# Patient Record
Sex: Female | Born: 1955 | Race: Black or African American | Hispanic: No | Marital: Married | Smoking: Former smoker
Health system: Southern US, Community
[De-identification: ages and names within clinical notes are randomized; demographics above are authoritative.]

## PROBLEM LIST (undated history)

## (undated) DIAGNOSIS — E785 Hyperlipidemia, unspecified: Secondary | ICD-10-CM

## (undated) DIAGNOSIS — D689 Coagulation defect, unspecified: Secondary | ICD-10-CM

## (undated) DIAGNOSIS — I10 Essential (primary) hypertension: Secondary | ICD-10-CM

## (undated) HISTORY — PX: VASCULAR SURGERY: SHX849

## (undated) HISTORY — DX: Hyperlipidemia, unspecified: E78.5

## (undated) HISTORY — DX: Coagulation defect, unspecified: D68.9

## (undated) HISTORY — PX: FOOT AMPUTATION: SHX951

## (undated) HISTORY — DX: Essential (primary) hypertension: I10

---

## 1997-08-04 ENCOUNTER — Inpatient Hospital Stay (HOSPITAL_COMMUNITY): Admission: EM | Admit: 1997-08-04 | Discharge: 1997-08-07 | Payer: Self-pay | Admitting: Emergency Medicine

## 1999-10-10 ENCOUNTER — Encounter: Admission: RE | Admit: 1999-10-10 | Discharge: 1999-10-10 | Payer: Self-pay | Admitting: Family Medicine

## 1999-10-10 ENCOUNTER — Encounter: Payer: Self-pay | Admitting: Family Medicine

## 2000-03-09 ENCOUNTER — Encounter: Admission: RE | Admit: 2000-03-09 | Discharge: 2000-03-09 | Payer: Self-pay | Admitting: Family Medicine

## 2000-03-09 ENCOUNTER — Encounter: Payer: Self-pay | Admitting: Family Medicine

## 2000-11-02 ENCOUNTER — Inpatient Hospital Stay (HOSPITAL_COMMUNITY): Admission: EM | Admit: 2000-11-02 | Discharge: 2000-11-03 | Payer: Self-pay | Admitting: Emergency Medicine

## 2000-11-02 ENCOUNTER — Encounter: Payer: Self-pay | Admitting: Emergency Medicine

## 2002-06-21 ENCOUNTER — Ambulatory Visit (HOSPITAL_COMMUNITY): Admission: RE | Admit: 2002-06-21 | Discharge: 2002-06-21 | Payer: Self-pay | Admitting: Internal Medicine

## 2003-08-21 ENCOUNTER — Emergency Department (HOSPITAL_COMMUNITY): Admission: EM | Admit: 2003-08-21 | Discharge: 2003-08-21 | Payer: Self-pay | Admitting: Emergency Medicine

## 2004-05-01 ENCOUNTER — Ambulatory Visit (HOSPITAL_COMMUNITY): Admission: RE | Admit: 2004-05-01 | Discharge: 2004-05-01 | Payer: Self-pay | Admitting: Internal Medicine

## 2004-05-03 ENCOUNTER — Emergency Department (HOSPITAL_COMMUNITY): Admission: EM | Admit: 2004-05-03 | Discharge: 2004-05-03 | Payer: Self-pay | Admitting: Family Medicine

## 2004-05-21 ENCOUNTER — Ambulatory Visit (HOSPITAL_COMMUNITY): Admission: RE | Admit: 2004-05-21 | Discharge: 2004-05-21 | Payer: Self-pay | Admitting: Orthopedic Surgery

## 2004-05-21 ENCOUNTER — Ambulatory Visit (HOSPITAL_BASED_OUTPATIENT_CLINIC_OR_DEPARTMENT_OTHER): Admission: RE | Admit: 2004-05-21 | Discharge: 2004-05-21 | Payer: Self-pay | Admitting: Orthopedic Surgery

## 2004-06-02 ENCOUNTER — Encounter: Admission: RE | Admit: 2004-06-02 | Discharge: 2004-07-22 | Payer: Self-pay | Admitting: Orthopedic Surgery

## 2004-09-14 ENCOUNTER — Emergency Department (HOSPITAL_COMMUNITY): Admission: EM | Admit: 2004-09-14 | Discharge: 2004-09-14 | Payer: Self-pay | Admitting: Family Medicine

## 2005-01-02 ENCOUNTER — Emergency Department (HOSPITAL_COMMUNITY): Admission: EM | Admit: 2005-01-02 | Discharge: 2005-01-02 | Payer: Self-pay | Admitting: Family Medicine

## 2005-06-16 ENCOUNTER — Ambulatory Visit (HOSPITAL_COMMUNITY): Admission: RE | Admit: 2005-06-16 | Discharge: 2005-06-16 | Payer: Self-pay | Admitting: Internal Medicine

## 2006-07-05 ENCOUNTER — Ambulatory Visit (HOSPITAL_COMMUNITY): Admission: RE | Admit: 2006-07-05 | Discharge: 2006-07-05 | Payer: Self-pay | Admitting: Family Medicine

## 2007-07-11 ENCOUNTER — Ambulatory Visit (HOSPITAL_COMMUNITY): Admission: RE | Admit: 2007-07-11 | Discharge: 2007-07-11 | Payer: Self-pay | Admitting: Family Medicine

## 2007-12-30 DIAGNOSIS — I10 Essential (primary) hypertension: Secondary | ICD-10-CM | POA: Insufficient documentation

## 2007-12-30 DIAGNOSIS — F329 Major depressive disorder, single episode, unspecified: Secondary | ICD-10-CM

## 2007-12-30 DIAGNOSIS — J45909 Unspecified asthma, uncomplicated: Secondary | ICD-10-CM | POA: Insufficient documentation

## 2007-12-30 DIAGNOSIS — E785 Hyperlipidemia, unspecified: Secondary | ICD-10-CM

## 2008-01-02 ENCOUNTER — Ambulatory Visit: Payer: Self-pay | Admitting: Pulmonary Disease

## 2008-01-02 DIAGNOSIS — G4710 Hypersomnia, unspecified: Secondary | ICD-10-CM | POA: Insufficient documentation

## 2008-01-02 DIAGNOSIS — E669 Obesity, unspecified: Secondary | ICD-10-CM | POA: Insufficient documentation

## 2008-01-02 DIAGNOSIS — B370 Candidal stomatitis: Secondary | ICD-10-CM

## 2008-01-02 DIAGNOSIS — J309 Allergic rhinitis, unspecified: Secondary | ICD-10-CM | POA: Insufficient documentation

## 2008-01-03 ENCOUNTER — Telehealth: Payer: Self-pay | Admitting: Pulmonary Disease

## 2008-01-12 ENCOUNTER — Inpatient Hospital Stay (HOSPITAL_COMMUNITY): Admission: EM | Admit: 2008-01-12 | Discharge: 2008-01-19 | Payer: Self-pay | Admitting: Emergency Medicine

## 2008-01-12 ENCOUNTER — Encounter: Payer: Self-pay | Admitting: Vascular Surgery

## 2008-01-12 ENCOUNTER — Ambulatory Visit: Payer: Self-pay | Admitting: Internal Medicine

## 2008-01-12 ENCOUNTER — Ambulatory Visit: Payer: Self-pay | Admitting: Vascular Surgery

## 2008-01-13 ENCOUNTER — Encounter: Payer: Self-pay | Admitting: Vascular Surgery

## 2008-01-17 ENCOUNTER — Encounter: Payer: Self-pay | Admitting: Cardiology

## 2008-01-17 ENCOUNTER — Encounter: Payer: Self-pay | Admitting: Pulmonary Disease

## 2008-01-24 ENCOUNTER — Ambulatory Visit: Payer: Self-pay | Admitting: Vascular Surgery

## 2008-01-27 ENCOUNTER — Encounter (INDEPENDENT_AMBULATORY_CARE_PROVIDER_SITE_OTHER): Payer: Self-pay | Admitting: Gastroenterology

## 2008-01-27 ENCOUNTER — Emergency Department (HOSPITAL_COMMUNITY): Admission: EM | Admit: 2008-01-27 | Discharge: 2008-01-27 | Payer: Self-pay | Admitting: Emergency Medicine

## 2008-02-21 ENCOUNTER — Ambulatory Visit: Payer: Self-pay | Admitting: Vascular Surgery

## 2008-02-22 ENCOUNTER — Ambulatory Visit: Payer: Self-pay | Admitting: Vascular Surgery

## 2008-02-27 ENCOUNTER — Ambulatory Visit: Payer: Self-pay | Admitting: Vascular Surgery

## 2008-02-27 ENCOUNTER — Encounter: Payer: Self-pay | Admitting: Vascular Surgery

## 2008-02-27 ENCOUNTER — Inpatient Hospital Stay (HOSPITAL_COMMUNITY): Admission: RE | Admit: 2008-02-27 | Discharge: 2008-03-06 | Payer: Self-pay | Admitting: Vascular Surgery

## 2008-02-29 ENCOUNTER — Encounter: Payer: Self-pay | Admitting: Vascular Surgery

## 2008-03-13 ENCOUNTER — Ambulatory Visit: Payer: Self-pay | Admitting: Vascular Surgery

## 2008-03-27 ENCOUNTER — Ambulatory Visit: Payer: Self-pay | Admitting: Vascular Surgery

## 2008-04-03 ENCOUNTER — Encounter: Admission: RE | Admit: 2008-04-03 | Discharge: 2008-07-02 | Payer: Self-pay | Admitting: Family Medicine

## 2008-04-10 ENCOUNTER — Ambulatory Visit: Payer: Self-pay | Admitting: Vascular Surgery

## 2008-04-25 ENCOUNTER — Inpatient Hospital Stay (HOSPITAL_COMMUNITY): Admission: RE | Admit: 2008-04-25 | Discharge: 2008-05-01 | Payer: Self-pay | Admitting: Orthopedic Surgery

## 2008-04-25 ENCOUNTER — Encounter (INDEPENDENT_AMBULATORY_CARE_PROVIDER_SITE_OTHER): Payer: Self-pay | Admitting: Orthopedic Surgery

## 2008-05-31 ENCOUNTER — Inpatient Hospital Stay (HOSPITAL_COMMUNITY): Admission: RE | Admit: 2008-05-31 | Discharge: 2008-06-02 | Payer: Self-pay | Admitting: Orthopedic Surgery

## 2008-07-23 ENCOUNTER — Ambulatory Visit: Payer: Self-pay | Admitting: Vascular Surgery

## 2008-07-24 ENCOUNTER — Ambulatory Visit (HOSPITAL_COMMUNITY): Admission: RE | Admit: 2008-07-24 | Discharge: 2008-07-24 | Payer: Self-pay | Admitting: Family Medicine

## 2008-11-27 ENCOUNTER — Emergency Department (HOSPITAL_BASED_OUTPATIENT_CLINIC_OR_DEPARTMENT_OTHER): Admission: EM | Admit: 2008-11-27 | Discharge: 2008-11-28 | Payer: Self-pay | Admitting: Emergency Medicine

## 2008-11-28 ENCOUNTER — Ambulatory Visit: Payer: Self-pay | Admitting: Interventional Radiology

## 2008-12-13 ENCOUNTER — Ambulatory Visit: Payer: Self-pay | Admitting: Vascular Surgery

## 2009-01-14 ENCOUNTER — Emergency Department (HOSPITAL_BASED_OUTPATIENT_CLINIC_OR_DEPARTMENT_OTHER): Admission: EM | Admit: 2009-01-14 | Discharge: 2009-01-14 | Payer: Self-pay | Admitting: Emergency Medicine

## 2009-02-04 ENCOUNTER — Ambulatory Visit: Payer: Self-pay | Admitting: Vascular Surgery

## 2009-02-28 ENCOUNTER — Ambulatory Visit: Payer: Self-pay | Admitting: Vascular Surgery

## 2009-06-27 ENCOUNTER — Encounter: Admission: RE | Admit: 2009-06-27 | Discharge: 2009-06-27 | Payer: Self-pay | Admitting: Nurse Practitioner

## 2009-07-26 ENCOUNTER — Ambulatory Visit (HOSPITAL_COMMUNITY): Admission: RE | Admit: 2009-07-26 | Discharge: 2009-07-26 | Payer: Self-pay | Admitting: Family Medicine

## 2009-08-02 ENCOUNTER — Encounter: Admission: RE | Admit: 2009-08-02 | Discharge: 2009-08-02 | Payer: Self-pay | Admitting: Family Medicine

## 2009-12-01 IMAGING — CR DG TIBIA/FIBULA 2V*L*
4 series · 4 of 4 positions shown · non-contrast
Comparison: Left knee films on 05/03/2004

CLINICAL DATA: Left lower leg pain.

LEFT TIBIA AND FIBULA - 2 VIEW

[t tib/fib ap left (1 of 2)]
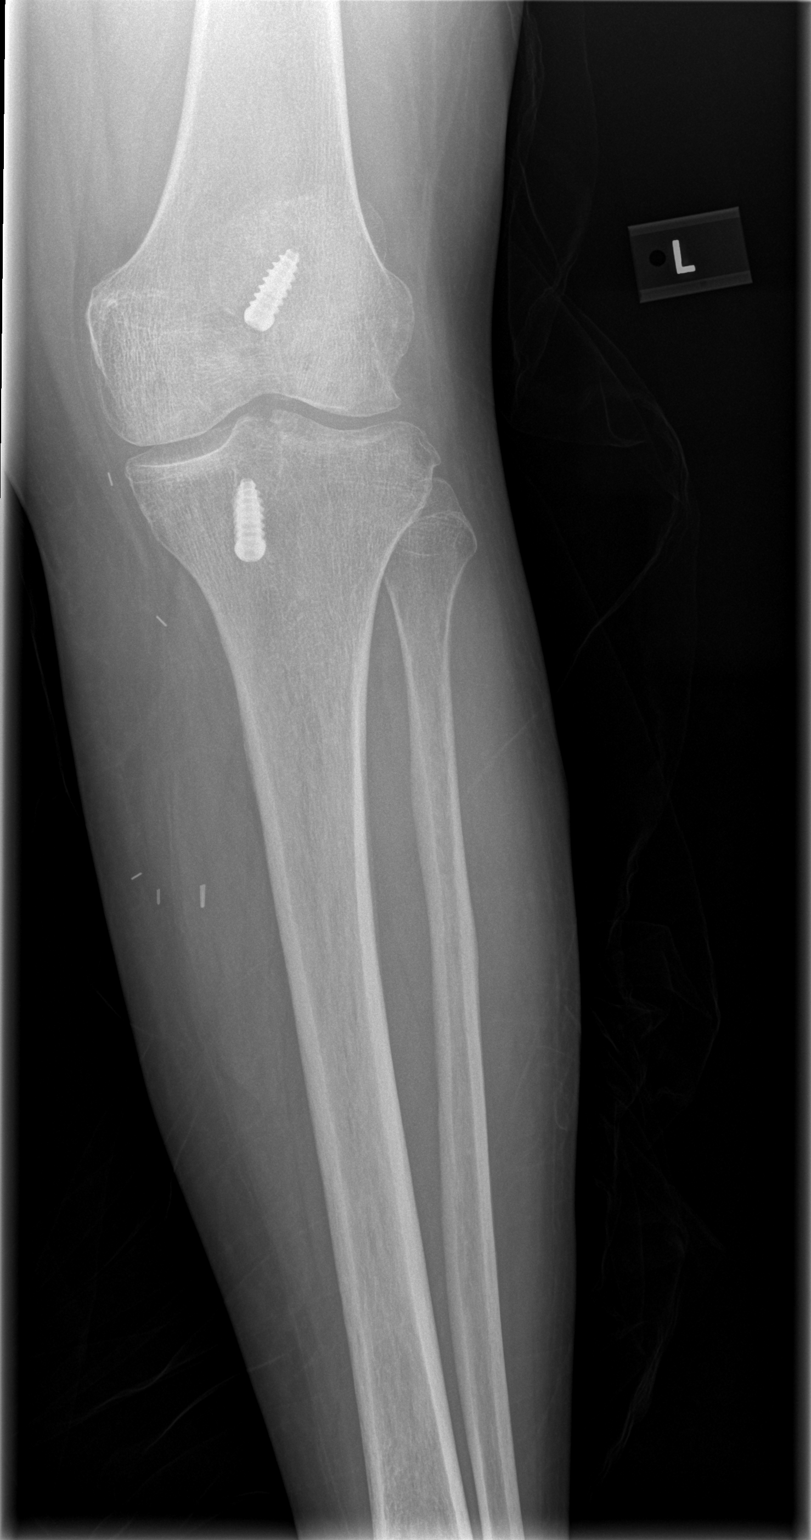

[t tib/fib ap left (2 of 2)]
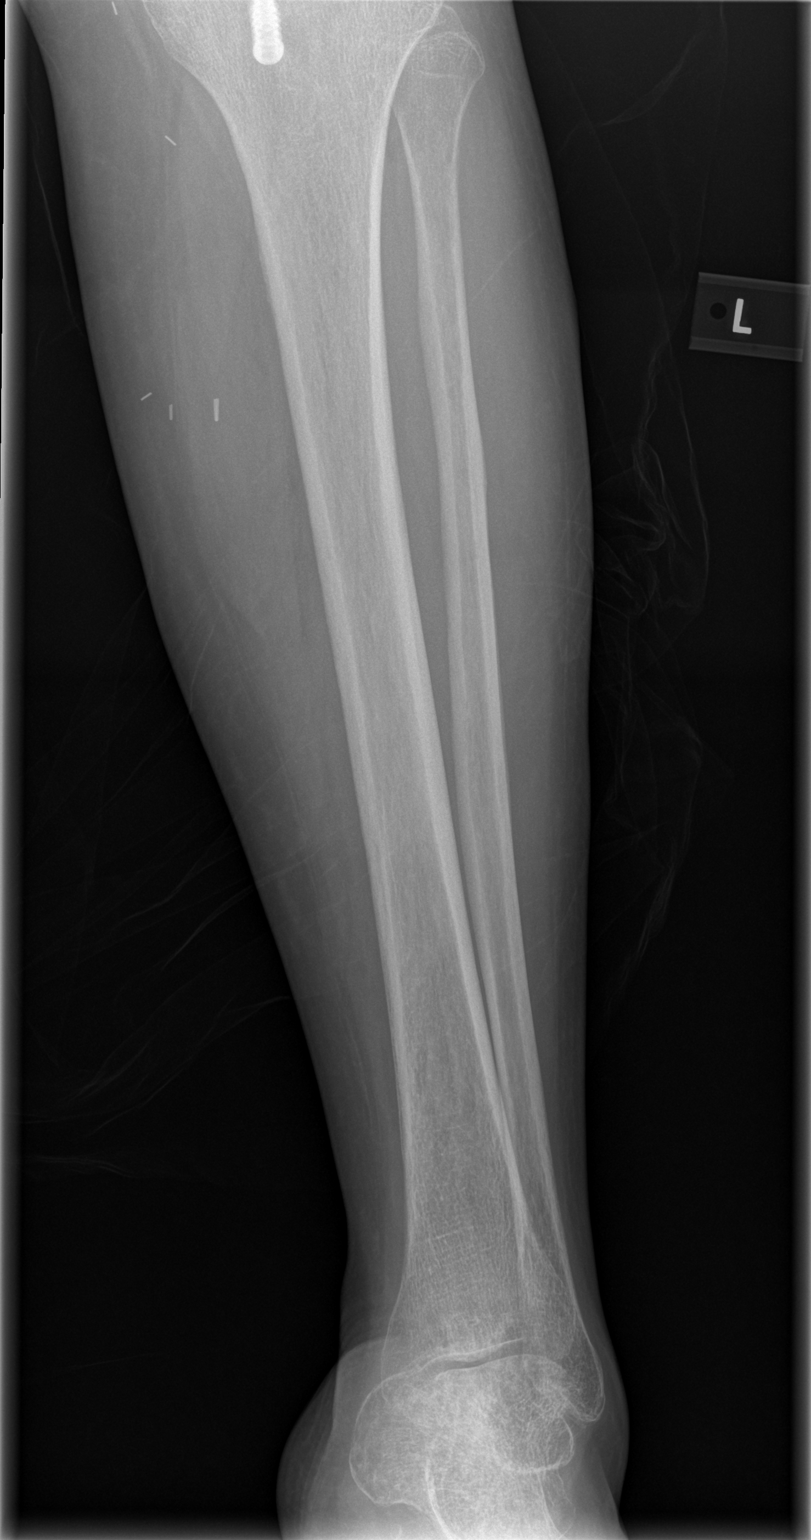

[t tib/fib lat left (1 of 2)]
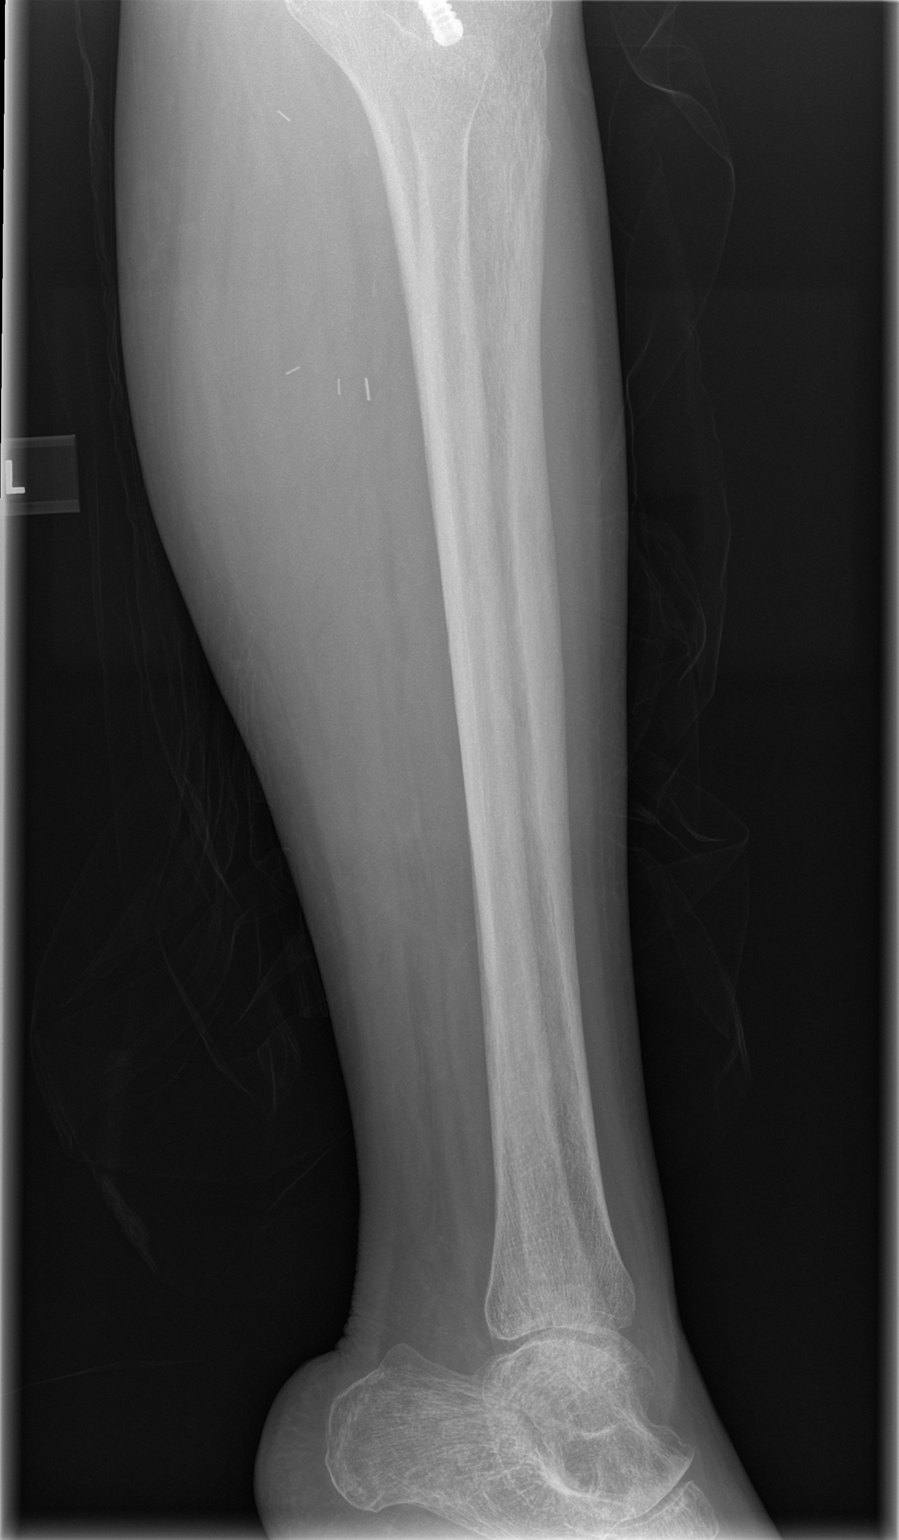

[t tib/fib lat left (2 of 2)]
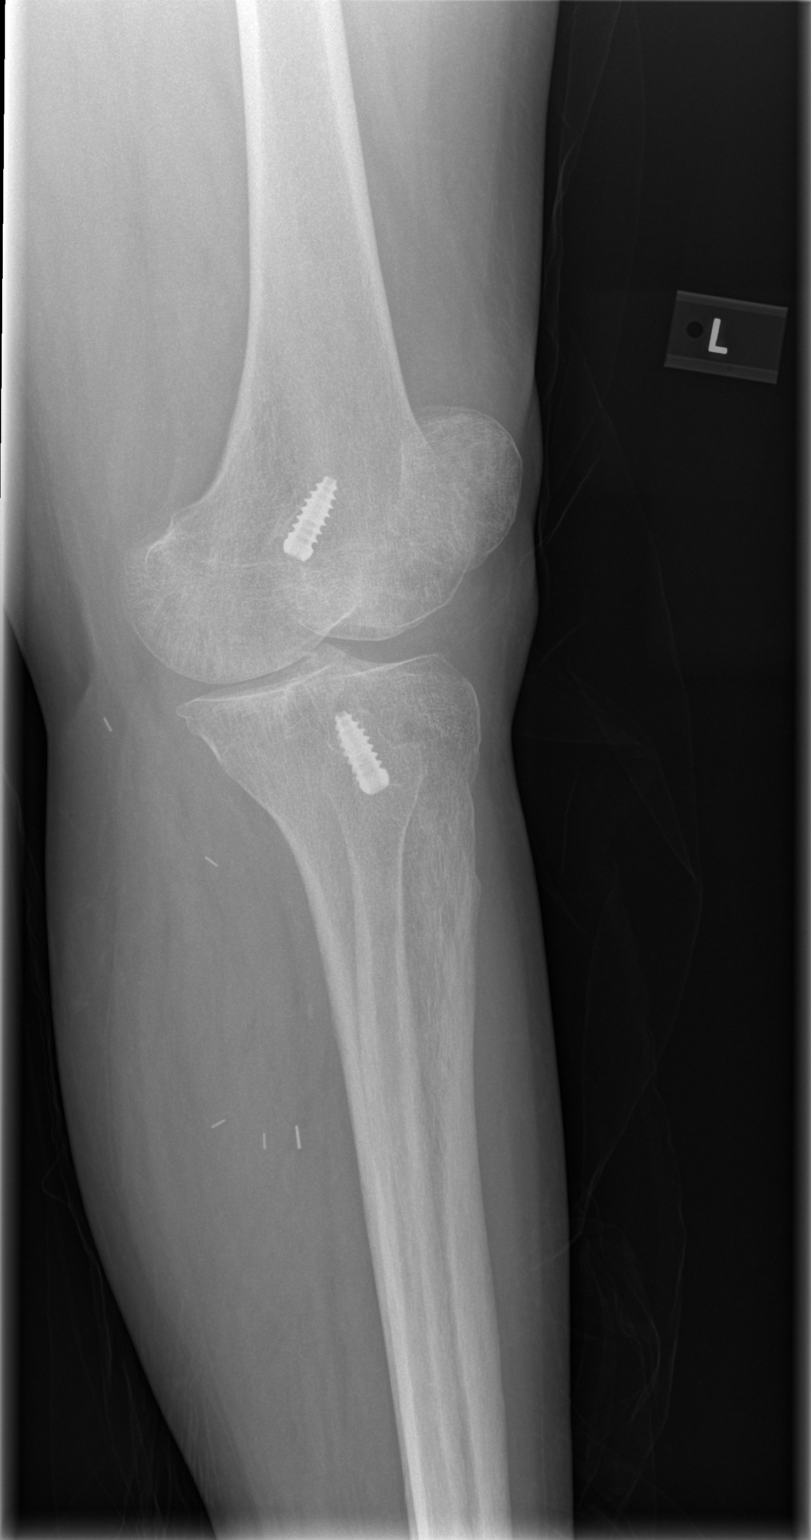

[4 of 4 positions shown; findings below may reference images not displayed]

FINDINGS: Bones show osteopenia.  No fracture, bony erosion or
lesion is seen.  The soft tissues are unremarkable.  There are
screws in place related to prior ACL surgery.
IMPRESSION: Osteopenia.  No acute abnormalities.

## 2010-02-11 ENCOUNTER — Encounter: Admission: RE | Admit: 2010-02-11 | Discharge: 2010-02-11 | Payer: Self-pay | Admitting: Family Medicine

## 2010-03-20 ENCOUNTER — Ambulatory Visit: Payer: Self-pay | Admitting: Vascular Surgery

## 2010-04-25 ENCOUNTER — Inpatient Hospital Stay (HOSPITAL_COMMUNITY)
Admission: EM | Admit: 2010-04-25 | Discharge: 2010-04-29 | Disposition: A | Discharge status: Home / Self Care | Payer: Self-pay | Source: Home / Self Care | Attending: Cardiology | Admitting: Cardiology

## 2010-04-25 LAB — BASIC METABOLIC PANEL
BUN: 15 mg/dL (ref 6–23)
CO2: 23 mEq/L (ref 19–32)
Chloride: 108 mEq/L (ref 96–112)
GFR calc Af Amer: >60 mL/min (ref >60)
Potassium: 3.4 mEq/L — ABNORMAL LOW (ref 3.5–5.1)

## 2010-04-25 LAB — POCT CARDIAC MARKERS: CKMB, poc: <1 ng/mL — ABNORMAL LOW (ref 1.0–8.0)

## 2010-04-25 LAB — CBC
MCH: 26.6 pg (ref 26.0–34.0)
MCHC: 31.7 g/dL (ref 30.0–36.0)
MCV: 84 fL (ref 78.0–100.0)
RDW: 16.2 % — ABNORMAL HIGH (ref 11.5–15.5)

## 2010-04-25 LAB — PROTIME-INR: INR: >10 (ref 0.00–1.49)

## 2010-04-25 LAB — GLUCOSE, CAPILLARY: Glucose-Capillary: 137 mg/dL — ABNORMAL HIGH (ref 70–99)

## 2010-04-25 LAB — TROPONIN I: Troponin I: <0.01 ng/mL (ref 0.00–0.06)

## 2010-04-26 LAB — GLUCOSE, CAPILLARY
Glucose-Capillary: 142 mg/dL — ABNORMAL HIGH (ref 70–99)
Glucose-Capillary: 115 mg/dL — ABNORMAL HIGH (ref 70–99)

## 2010-04-26 LAB — CARDIAC PANEL(CRET KIN+CKTOT+MB+TROPI)
CK, MB: 1.5 ng/mL (ref 0.3–4.0)
CK, MB: 1.3 ng/mL (ref 0.3–4.0)
Relative Index: 0.7 (ref 0.0–2.5)
Total CK: 159 U/L (ref 7–177)
Troponin I: 0.02 ng/mL (ref 0.00–0.06)

## 2010-04-26 LAB — HEMOGLOBIN A1C: Mean Plasma Glucose: 154 mg/dL — ABNORMAL HIGH (ref <117)

## 2010-04-26 LAB — PROTIME-INR
INR: 7.85 (ref 0.00–1.49)
Prothrombin Time: 65.4 seconds — ABNORMAL HIGH (ref 11.6–15.2)

## 2010-04-26 LAB — CBC
Hemoglobin: 12.7 g/dL (ref 12.0–15.0)
MCHC: 32.3 g/dL (ref 30.0–36.0)
RDW: 16.1 % — ABNORMAL HIGH (ref 11.5–15.5)
WBC: 12.2 10*3/uL — ABNORMAL HIGH (ref 4.0–10.5)

## 2010-04-26 LAB — COMPREHENSIVE METABOLIC PANEL
ALT: 19 U/L (ref 0–35)
AST: 19 U/L (ref 0–37)
Calcium: 8.9 mg/dL (ref 8.4–10.5)
GFR calc Af Amer: >60 mL/min (ref >60)
Sodium: 150 mEq/L — ABNORMAL HIGH (ref 135–145)
Total Protein: 7.5 g/dL (ref 6.0–8.3)

## 2010-04-26 LAB — LIPASE, BLOOD: Lipase: 27 U/L (ref 11–59)

## 2010-04-27 LAB — CBC
MCH: 26.2 pg (ref 26.0–34.0)
MCV: 82.9 fL (ref 78.0–100.0)
Platelets: 257 10*3/uL (ref 150–400)
RDW: 15.8 % — ABNORMAL HIGH (ref 11.5–15.5)

## 2010-04-27 LAB — BASIC METABOLIC PANEL
BUN: 13 mg/dL (ref 6–23)
Chloride: 104 mEq/L (ref 96–112)
Creatinine, Ser: 1.07 mg/dL (ref 0.4–1.2)

## 2010-04-27 LAB — PROTIME-INR: Prothrombin Time: 16.3 seconds — ABNORMAL HIGH (ref 11.6–15.2)

## 2010-04-28 ENCOUNTER — Encounter: Payer: Self-pay | Admitting: Cardiovascular Disease

## 2010-04-28 LAB — CBC
HCT: 34.3 % — ABNORMAL LOW (ref 36.0–46.0)
HCT: 35.6 % — ABNORMAL LOW (ref 36.0–46.0)
Hemoglobin: 11 g/dL — ABNORMAL LOW (ref 12.0–15.0)
Hemoglobin: 11.4 g/dL — ABNORMAL LOW (ref 12.0–15.0)
MCH: 26.7 pg (ref 26.0–34.0)
MCH: 26.4 pg (ref 26.0–34.0)
MCHC: 32.1 g/dL (ref 30.0–36.0)
MCHC: 32 g/dL (ref 30.0–36.0)
MCV: 83.3 fL (ref 78.0–100.0)
MCV: 82.4 fL (ref 78.0–100.0)
Platelets: 252 K/uL (ref 150–400)
Platelets: 276 K/uL (ref 150–400)
RBC: 4.12 MIL/uL (ref 3.87–5.11)
RBC: 4.32 MIL/uL (ref 3.87–5.11)
RDW: 15.6 % — ABNORMAL HIGH (ref 11.5–15.5)
RDW: 15.6 % — ABNORMAL HIGH (ref 11.5–15.5)
WBC: 12.3 K/uL — ABNORMAL HIGH (ref 4.0–10.5)
WBC: 12.1 K/uL — ABNORMAL HIGH (ref 4.0–10.5)

## 2010-04-28 LAB — BASIC METABOLIC PANEL
CO2: 23 mEq/L (ref 19–32)
Chloride: 104 mEq/L (ref 96–112)
GFR calc Af Amer: 58 mL/min — ABNORMAL LOW (ref >60)
Potassium: 3.6 mEq/L (ref 3.5–5.1)
Sodium: 139 mEq/L (ref 135–145)

## 2010-04-28 LAB — PROTIME-INR
INR: 1.3 (ref 0.00–1.49)
Prothrombin Time: 16.4 s — ABNORMAL HIGH (ref 11.6–15.2)

## 2010-04-28 LAB — GLUCOSE, CAPILLARY
Glucose-Capillary: 91 mg/dL (ref 70–99)
Glucose-Capillary: 114 mg/dL — ABNORMAL HIGH (ref 70–99)
Glucose-Capillary: 85 mg/dL (ref 70–99)
Glucose-Capillary: 104 mg/dL — ABNORMAL HIGH (ref 70–99)
Glucose-Capillary: 100 mg/dL — ABNORMAL HIGH (ref 70–99)

## 2010-04-28 LAB — HEPARIN LEVEL (UNFRACTIONATED)

## 2010-04-29 LAB — BASIC METABOLIC PANEL
BUN: 12 mg/dL (ref 6–23)
CO2: 26 mEq/L (ref 19–32)
Calcium: 9.3 mg/dL (ref 8.4–10.5)
Chloride: 106 mEq/L (ref 96–112)
Creatinine, Ser: 1.07 mg/dL (ref 0.4–1.2)
GFR calc Af Amer: >60 mL/min (ref >60)

## 2010-04-29 LAB — CBC
MCHC: 32.6 g/dL (ref 30.0–36.0)
Platelets: 269 10*3/uL (ref 150–400)
RDW: 15.3 % (ref 11.5–15.5)
WBC: 10.4 10*3/uL (ref 4.0–10.5)

## 2010-04-29 LAB — PROTIME-INR: INR: 1.13 (ref 0.00–1.49)

## 2010-04-29 LAB — HEPARIN LEVEL (UNFRACTIONATED): Heparin Unfractionated: <0.1 IU/mL — ABNORMAL LOW (ref 0.30–0.70)

## 2010-04-29 LAB — GLUCOSE, CAPILLARY
Glucose-Capillary: 101 mg/dL — ABNORMAL HIGH (ref 70–99)
Glucose-Capillary: 95 mg/dL (ref 70–99)

## 2010-04-30 NOTE — Discharge Summary (Addendum)
NAMESECILIA, APPS         ACCOUNT NO.:  [id number]  MEDICAL RECORD NO.:  [id number]          PATIENT TYPE:  INP  LOCATION:  3707                         FACILITY:  [facility]  PHYSICIAN:  Madolyn Frieze. Jens Som, MD, FACCDATE OF BIRTH:  1955-06-01  DATE OF ADMISSION:  04/25/2010 DATE OF DISCHARGE:  04/29/2010                              DISCHARGE SUMMARY   DISCHARGE DIAGNOSES: 1. Chest pain, negative for myocardial infarction.  Cardiac     catheterization on April 28, 2010, demonstrating normal coronary     arteries. 2. Coumadin therapy with supratherapeutic INR on admission is greater     than 10, treated with vitamin K.  INR felt subtherapeutic at     discharge, requiring cross coverage with Lovenox.  Discharge INR is     1.13. 3. Peripheral vascular disease.     a.     Prior embolectomy for last iliofemoral embolism requiring      patch angioplasty of left femoral artery and tibial left knee.     b.     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 4. Hypertension. 5. Diabetes mellitus. 6. Family history of coronary artery disease. 7. Former tobacco abuse.  HOSPITAL COURSE:  Ms. [name] is a 55 year old female with a hypercoagulable state, previously on Coumadin including peripheral vascular disease, hypertension, and diabetes.  She presented to the hospital with complaints of substernal chest pain starting around 11:30 in the morning.  In the ER, there was no evidence of ST-elevation MI, but there are dynamic T-wave changes.  She was started on heparin and nitroglycerin up until which point, it was discovered the patient was on Coumadin, her INR was greater than 10.  Her heparin was discontinued. The patient was initially given 2 mg of vitamin K.  She was also given fentanyl and Lopressor and her pain was slowly was resolving.  She also had some nausea and vomiting and KUB, amylase, and lipase were  normal. Even with successive doses of vitamin K, her INR still remained supratherapeutic, so she received another dose.  On April 27, 2010, her INR fell to 1.29.  Because of her concerning issues including chest pain, she underwent cardiac catheterization on April 28, 2010, by Dr. [name], who actually found no significant coronary artery disease. Ejection fraction was 55%.  Because of her prior embolic event to her left leg and hypercoagulable state, she was evaluated for cross coverage with Lovenox and was deemed a candidate for such.  Pharmacy is recommend her to go home on 120 mg daily.  I have spoken with pharmacy with regards to Coumadin dosing at discharge given her extremely high INR on admission.  __________ 5 mg daily with an INR check on May 02, 2010, which will be arranged.  See the patient's primary care provider.  Dr. Jens Som has seen and examined her today and feels, she is stable for discharge.  DISCHARGE LABORATORY DATA:  WBC 10.4, hemoglobin 11.7, hematocrit 35.9, platelet count 259, INR at discharge 1.13.  Sodium 141, potassium 2.3, chloride 106, CO2 of 26, glucose 100, BUN 12, creatinine 1.07.  LFTs  were normal on April 18, 2010.  Hemoglobin received 7.0.  Cardiac enzymes negative x4 with exception of mildly increased CK up to 188 and 2 of the markers.  TSH was normal of 0.58.  STUDIES: 1. Abdominal ultrasound on April 18, 2010, showed no bowel     obstructions.  Both large and small bowel gas is present without     distention. 2. Chest x-ray on April 25, 2010, showed no acute disease. 3. Cardiac catheterization on April 28, 2010, demonstrated no     significant coronary artery disease.  Ejection fraction was 55%.     Please see old report for details.  DISCHARGE MEDICATIONS: 1. Lovenox 120 mg subcutaneously daily. 2. Toprol-XL 25 mg daily. 3. Coumadin 5 mg daily with instructions take 1 tablet daily for now     and have her INR checks on Friday  May 02, 2010. 4. Fenofibrate 160 mg daily. 5. Gabapentin 300 mg t.i.d. p.r.n. neuropathy. 6. Glipizide 10 mg daily. 7. Guaifenesin/codeine 10 mL every 6 hours as needed. 8. Lantus 15-18 units subcutaneously at bedtime. 9. Mirtazapine 15 mg at bedtime. 10.Pravastatin 40 mg at bedtime. 11.Spiriva 18 mcg inhaled daily. 12.Ventolin inhaler 2 puffs q.4 h. p.r.n.  Please note the patient's ACE inhibitor was stopped on admission and will be having discharge secondary to borderline blood pressure in low 100s.  It may be restarted as an outpatient, she is stable.  DISPOSITION:  [name] will be discharged in stable condition to home.  She is not to lift or participate in sexual activity for 1 week, drive for 2 days, and may shower.  She is to follow low-sodium, heart- healthy diabetic diet.  If she notices any pain, swelling, bleeding, or pus at cath site, she is to call or return.  She will be given a lab certificate to her primary care provider's office on Friday May 02, 2010, for an INR check.  She will follow up for a post hospital wound check on May 12, 2010, at 10:30 a.m., but then following Dr.Abdul Beirne did not believe that she will require any further follow up with Cardiology.  DURATION OF DISCHARGE ENCOUNTER:  Greater than 30 minutes including physician and PA time.     Dayna Dunn, P.A.C.   ______________________________ Madolyn Frieze. Jens Som, MD, [facility]    DD/MEDQ  D:  04/29/2010  T:  04/30/2010  Job:  [id number]  cc:   Madolyn Frieze. Jens Som, MD, [facility]  Electronically Signed by Ronie Spies  on 05/12/2010 09:42:52 PM Electronically Signed by Olga Millers MD [facility] on 05/14/2010 07:19:55 PM

## 2010-05-01 NOTE — Procedures (Signed)
  [name], [name]         ACCOUNT NO.:  [id number]  MEDICAL RECORD NO.:  [id number]          PATIENT TYPE:  INP  LOCATION:  3707                         FACILITY:  [facility]  PHYSICIAN:  Noralyn Pick. [name], MD, FACCDATE OF BIRTH:  1955-09-26  DATE OF PROCEDURE:  04/28/2010 DATE OF DISCHARGE:                           CARDIAC CATHETERIZATION   A 55 year old patient with hypercoagulable state previously on Coumadin admitted to the hospital for chest pain.  Cine catheterization was done with 5-French catheters from right femoral artery.  At the end of the case, we had good hemostasis with AngioSeal.  Left main coronary artery had a 20% proximal stenosis.  Left anterior descending artery was normal.  First diagonal branch was extremely large and normal.  Second diagonal branch was small and normal.  Circumflex coronary artery was nondominant.  It was normal.  There was a small first obtuse marginal branch, which was normal.  In the AV groove branch turned into a second obtuse marginal branch, which was normal.  Right coronary artery was dominant and normal.  RAO ventriculography.  RAO ventriculography was normal.  EF of 55%. There was no gradient across the aortic valve.  No MR.  LV pressure was 130/16.  Aortic pressure was 129/71.  IMPRESSION:  The patient has no significant coronary artery disease.  I will resume her heparin without a bolus later tonight and give her a dose of Coumadin.  We will try to arrange home Lovenox to help expedite her discharge.  She has had a previous embolic event to her left leg and has a hypercoagulable state.  I believe she has also had a pulmonary emboli.  She should not be discharged unless she has coverage in regards to her anticoagulation.     Noralyn Pick. [name], MD, [facility]     PCN/MEDQ  D:  04/28/2010  T:  04/29/2010  Job:  [id number]  cc:   Learta Codding, MD,FACC  Electronically Signed by Charlton Haws MD [facility] on 05/01/2010 10:38:54  PM

## 2010-05-12 ENCOUNTER — Ambulatory Visit: Payer: Self-pay | Admitting: Physician Assistant

## 2010-05-13 ENCOUNTER — Other Ambulatory Visit: Payer: Self-pay | Admitting: Nurse Practitioner

## 2010-05-13 DIAGNOSIS — J189 Pneumonia, unspecified organism: Secondary | ICD-10-CM

## 2010-05-15 NOTE — Cardiovascular Report (Addendum)
Summary: [facility] Cardiac Cath  [facility] Cardiac Cath   Imported By: Earl Many 05/07/2010 16:22:26  _____________________________________________________________________  External Attachment:    Type:   Image     Comment:   External Document

## 2010-05-29 NOTE — H&P (Signed)
[name], [name]         ACCOUNT NO.:  [id number]  MEDICAL RECORD NO.:  [id number]          PATIENT TYPE:  INP  LOCATION:  2913                         FACILITY:  [facility]  PHYSICIAN:  Learta Codding, MD,FACC DATE OF BIRTH:  [birth year]  DATE OF ADMISSION:  04/25/2010 DATE OF DISCHARGE:                             HISTORY & PHYSICAL   REASON FOR ADMISSION:  Unstable angina.  HISTORY OF PRESENT ILLNESS:  The patient is a very pleasant 55 year old African American female with a history of peripheral vascular disease status post prior embolectomy for left iliofemoral embolism required patch angioplasty of the left femoral artery and tibial embolectomy. Initially, the patient had an amputation of the left fourth toe.  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  She has no significant prior cardiac history. She did have an echocardiogram done in 2009, which was read by Dr. Jens Som, her ejection fraction was normal.  There was no evidence of source of emboli.  The patient has multiple cardiac risk factors including hypertension, diabetes mellitus, and a family history of coronary artery disease.  The patient is also a former smoker.  She quit recently a couple of weeks ago.  She is now admitted with new onset substernal chest pain which started at around 11:30 this morning.  The pain was intermittent, but severe substernal with radiation to the right neck area and somewhat into the left arm.  She really could not get comfortable for most of the day. She finally came to the Emergency Room where it was found that she had dynamic EKG changes.  There was no evidence of ST elevation myocardial infarction, but there was dynamic T-wave changes.  When I saw the patient and coming at the bedside, she was still having pretty severe pain which was than 8/10.  She was already started on heparin and nitroglycerin, albeit at lower  dose.  While examining the patient, the nurse notified me that her INR was greater than 10 and heparin was discontinued.  We will also get an order to give the patient 2 mg of vitamin K.  She was given 50 mcg of IV fentanyl and is given Lopressor. Her pain is slowly abating.  We got another EKG and she now has upright T-wave changes suggestive of that she has LAD and coronary artery disease.  The patient is also diabetic for which she takes insulin.  She states that she was admitted 2 weeks ago to [facility] for pneumonia.  She did require antibiotics after hospital discharge also.  This is likely the reason for her elevated INR.  Initial set of cardiac enzymes was within normal limits.  Troponin was less than 0.055.  ALLERGIES:  Reportedly the patient is allergic to CODEINE.  She is also allergic to [facility], in addition she is allergic to MORPHINE.  SOCIAL HISTORY:  The patient is disabled given her peripheral vascular disease.  She quit smoking 4 weeks ago.  FAMILY HISTORY:  Noted for father who had a stent placed in the pacemaker.  MEDICATIONS: 1. Coumadin. 2. Fenofibrate. 3. Glipizide 10 mg once a day. 4. Gabapentin  300 mg 3 times a day. 5. Mirtazapine 15 mg at bedtime. 6. Pravastatin 80 mg p.o. at bedtime. 7. Spiriva inhalers.  REVIEW OF SYSTEMS:  The patient reported some diaphoresis during her chest pain.  She also had shortness of breath.  She denies any orthopnea, PND, palpitations, or syncope.  She did have some dizziness. She has no known allergies.  She had dysuria or frequency.  Remainder of 18 point review of systems was otherwise negative.  PHYSICAL EXAMINATION:  VITAL SIGNS:  Blood pressure 110/80, heart rate is 82 beats per minute, respirations are 16, and saturation 94%, GENERAL:  Well-nourished African American female in mild distress. HEENT:  PERLA.  EOMI.  Throat is clear. NECK:  Supple and normal carotid upstroke and no carotid bruits.   No thyromegaly.  No cervical or supraclavicular adenopathy. LUNGS:  Clear bilaterally. HEART:  Regular rate and rhythm with normal S1, S2 and no pathological murmurs. ABDOMEN:  Supple, nontender.  No rebound or guarding.  Good bowel sounds. EXTREMITIES:  No cyanosis, clubbing, or edema.  The patient is status post left mid foot amputation.  She has a good palpable dorsalis pedis pulse and posterior tibial pulse in the right leg. SKIN:  Warm and dry. NEUROLOGIC:  The patient is alert, oriented, and grossly nonfocal.  LABORATORY DATA:  Obtained in the emergency room; white count is 10.9, hemoglobin is 13.3, hematocrit 42, platelet count is 266.  Sodium 142, potassium 3.4, chloride is 108, carbon dioxide 23, glucose 104, BUN is 15, creatinine is 0.99, calcium is 9.3, GFR is greater than 60 mL per minute.  As outlined above, troponin was less than 0.05 and a myoglobin was also within normal limits.  Per laboratory report, the patient's INR was greater than 10 and as outlined above.  Again, she will be administered vitamin K.  PROBLEM LIST: 1. Unstable coronary syndrome. 2. Multiple cardiac risk factors are pending.     a.     Diabetes mellitus.     b.     Hypertension.     c.     Family history of coronary artery disease.     d.     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 3. Former tobacco use. 4. Recent pneumonia treated in [facility]. 5. Chronic anticoagulation with Coumadin.  PLAN: 1. The patient has an acute coronary syndrome.  She has dynamic EKG     changes.  Anticipate that her second troponin will be positive.     She had quite impressive chest pain on admission to the ER, but is     now feeling better.  She had been given several doses of fentanyl     and high dose IV nitroglycerin. 2. PT/INR is markedly elevated and the patient is anticoagulated and     she will be given vitamin K.  Hopefully, we can stabilize her as     well as not to need  and urgent catheterization which require FFP. 3. We will hold off on heparin for now.  We will serially obtain her     PT/INR every 6 hours until further     improvement. 4. I have discussed with the patient the need for diagnostic cardiac     catheterization and the patient has agreed to proceed.  This will     be done urgently if she becomes unstable, otherwise elective on     Monday.     Vernie Shanks  DeGent, MD,FACC   ______________________________ Learta Codding, MD,FACC    GED/MEDQ  D:  04/25/2010  T:  04/26/2010  Job:  [id number]  Electronically Signed by Lewayne Bunting MDFACC on 05/29/2010 10:27:06 AM

## 2010-07-03 LAB — PROTIME-INR: Prothrombin Time: 56.6 seconds — ABNORMAL HIGH (ref 11.6–15.2)

## 2010-07-04 LAB — CBC
MCV: 83 fL (ref 78.0–100.0)
Platelets: 265 10*3/uL (ref 150–400)
RBC: 4.73 MIL/uL (ref 3.87–5.11)
WBC: 15.6 10*3/uL — ABNORMAL HIGH (ref 4.0–10.5)

## 2010-07-04 LAB — LACTIC ACID, PLASMA: Lactic Acid, Venous: 1.1 mmol/L (ref 0.5–2.2)

## 2010-07-04 LAB — DIFFERENTIAL
Basophils Relative: 1 % (ref 0–1)
Eosinophils Relative: 3 % (ref 0–5)
Lymphocytes Relative: 24 % (ref 12–46)
Monocytes Relative: 5 % (ref 3–12)
Neutrophils Relative %: 67 % (ref 43–77)

## 2010-07-04 LAB — BASIC METABOLIC PANEL
BUN: 25 mg/dL — ABNORMAL HIGH (ref 6–23)
Chloride: 105 mEq/L (ref 96–112)
Creatinine, Ser: 1.2 mg/dL (ref 0.4–1.2)
GFR calc Af Amer: 57 mL/min — ABNORMAL LOW (ref >60)
GFR calc non Af Amer: 47 mL/min — ABNORMAL LOW (ref >60)

## 2010-07-04 LAB — PROTIME-INR
INR: 1 (ref 0.00–1.49)
Prothrombin Time: 13.3 seconds (ref 11.6–15.2)

## 2010-07-10 LAB — PROTIME-INR
INR: 1.9 — ABNORMAL HIGH (ref 0.00–1.49)
INR: 2.6 — ABNORMAL HIGH (ref 0.00–1.49)
Prothrombin Time: 61.1 seconds — ABNORMAL HIGH (ref 11.6–15.2)

## 2010-07-10 LAB — COMPREHENSIVE METABOLIC PANEL
ALT: 17 U/L (ref 0–35)
Alkaline Phosphatase: 61 U/L (ref 39–117)
BUN: 18 mg/dL (ref 6–23)
CO2: 25 mEq/L (ref 19–32)
Chloride: 104 mEq/L (ref 96–112)
Glucose, Bld: 102 mg/dL — ABNORMAL HIGH (ref 70–99)
Potassium: 3.7 mEq/L (ref 3.5–5.1)
Sodium: 138 mEq/L (ref 135–145)
Total Bilirubin: 0.2 mg/dL — ABNORMAL LOW (ref 0.3–1.2)
Total Protein: 7.5 g/dL (ref 6.0–8.3)

## 2010-07-10 LAB — CBC
HCT: 34.2 % — ABNORMAL LOW (ref 36.0–46.0)
Hemoglobin: 11 g/dL — ABNORMAL LOW (ref 12.0–15.0)
RBC: 4.63 MIL/uL (ref 3.87–5.11)
RDW: 22.6 % — ABNORMAL HIGH (ref 11.5–15.5)
WBC: 12 10*3/uL — ABNORMAL HIGH (ref 4.0–10.5)

## 2010-07-14 LAB — COMPREHENSIVE METABOLIC PANEL
Albumin: 2.9 g/dL — ABNORMAL LOW (ref 3.5–5.2)
Alkaline Phosphatase: 104 U/L (ref 39–117)
BUN: 15 mg/dL (ref 6–23)
CO2: 27 mEq/L (ref 19–32)
Chloride: 101 mEq/L (ref 96–112)
Glucose, Bld: 108 mg/dL — ABNORMAL HIGH (ref 70–99)
Potassium: 3.5 mEq/L (ref 3.5–5.1)
Total Bilirubin: <0.1 mg/dL — ABNORMAL LOW (ref 0.3–1.2)

## 2010-07-14 LAB — PROTIME-INR
INR: 1.3 (ref 0.00–1.49)
INR: 2.1 — ABNORMAL HIGH (ref 0.00–1.49)
Prothrombin Time: 15.3 seconds — ABNORMAL HIGH (ref 11.6–15.2)
Prothrombin Time: 25 seconds — ABNORMAL HIGH (ref 11.6–15.2)

## 2010-07-14 LAB — CBC
HCT: 29 % — ABNORMAL LOW (ref 36.0–46.0)
Hemoglobin: 9.2 g/dL — ABNORMAL LOW (ref 12.0–15.0)
RBC: 3.93 MIL/uL (ref 3.87–5.11)
WBC: 11.7 10*3/uL — ABNORMAL HIGH (ref 4.0–10.5)

## 2010-07-15 LAB — PROTIME-INR: Prothrombin Time: 37.8 seconds — ABNORMAL HIGH (ref 11.6–15.2)

## 2010-08-12 NOTE — Assessment & Plan Note (Signed)
OFFICE VISIT   [name], [name]  DOB:  Jul 08, 1955                                       01/24/2008  WGNFA#:[id number]   I saw the patient in the office today for followup after her recent  iliofemoral thrombectomy and tibial thrombectomy.  This is Ramos pleasant 55-  year-old woman who had developed the sudden onset of left calf pain and  paresthesias in the left foot.  She was evaluated in the emergency  department and found to have diminished femoral pulse on the left with  no Doppler flow in the left foot.  She had no reason to have Ramos cardiac  source of embolization.  It was felt that she could potentially have an  iliac stenosis and underwent an arteriogram which showed thrombus in the  left common iliac artery and also embolic disease of the tibial peroneal  trunk on the left with occlusion of the posterior tibial and peroneal  arteries.  She was taken to the operating room and underwent left  iliofemoral embolectomy with vein patch angioplasty of the left common  femoral artery, tibial embolectomy with vein patch angioplasty of the  tibial peroneal trunk.  Postoperatively she was started on heparin and  then converted to Coumadin.  During her hospitalization she did undergo  Ramos transesophageal echo which showed no evidence of cardiac source for  embolization.  She did well postoperatively and was discharged home on  Coumadin.  She returns for her first outpatient visit.   Her only complaint is some pain in the toes of the left foot which has  been fairly constant but is gradually improving.  She has had no  significant claudication.   PHYSICAL EXAMINATION:  Vital signs:  On physical examination her blood  pressure is 126/86, heart rate is 118.  Lungs:  Are clear bilaterally to  auscultation.  Her left groin incision is healing nicely.  She has  palpable femoral pulses.  She has brisk Doppler flow in the posterior  tibial, peroneal and dorsalis pedis  positions.  The foot is warm and  well-perfused.  Her below the knee incision is also healing adequately  and her staples were removed in the office today.   I explained that again we do not have Ramos clear-cut reason why she  embolized.  Her TEE was negative.  My plan will be to maintain her on  Coumadin for 3 months at which time we will stop it and perform Ramos  hypercoagulable workup and also repeat her aortogram to look for  proximal iliac stenosis on the left which may have caused her problem.  Her previous aortogram preoperatively did not show significant stenosis,  however, the clot in the proximal common iliac artery may have masked an  underlying lesion.  I have instructed her to call Dr. Alda Berthold office  today to arrange to have her pro-time checked.  We had written on her  discharge instructions to call yesterday to arrange this.  We will see  her back in early January at which time we will stop her Coumadin, order  Ramos hypercoagulable workup and also schedule her arteriogram.   Di Kindle. Edilia Bo, M.D.  Electronically Signed   CSD/MEDQ  D:  01/24/2008  T:  01/25/2008  Job:  [id number]   cc:   Tammy R. Collins [place], M.D.

## 2010-08-12 NOTE — Procedures (Signed)
AORTA-ILIAC DUPLEX EVALUATION   INDICATION:  Follow up lower extremity revascularization.   HISTORY:  Diabetes:  No.  Cardiac:  No.  Hypertension:  Yes.  Smoking:  Previous.  Previous Surgery:  Thrombectomy of left common iliac artery, posterior  tibial artery, and peroneal artery as well as VPA of the left common  femoral artery and tibioperoneal trunk, all by Dr. Edilia Bo, 01/12/2008.  Left partial foot amputation.               SINGLE LEVEL ARTERIAL EXAM                              RIGHT                  LEFT  Brachial:                  123                    121  Anterior tibial:           132                    94  Posterior tibial:          130                    103  Peroneal:                                         109  Ankle/brachial index:      1.07                   0.89  Previous ABI/date:         07/23/2008, 1.06       07/23/2008, 1.16   AORTA-ILIAC DUPLEX EXAM  Aorta - Proximal     91 cm/s  Aorta - Mid          99  cm/s  Aorta - Distal       84 cm/s   RIGHT                                   LEFT                    CIA-PROXIMAL          121 cm/s                    CIA-DISTAL            98 cm/s                    HYPOGASTRIC           128 cm/s                    EIA-PROXIMAL          100 cm/s                    EIA-MID               83 cm/s  EIA-DISTAL            97 cm/s   IMPRESSION:  1. Patent aorta, left iliacs, and left lower extremity arteries with      no evidence of focal stenosis.  2. Some areas of calf arteries had limited visualization due to      technical limitations.  3. Right ankle brachial index appears within normal limits and stable.  4. Left ankle brachial index is suggestive of mild arterial      compromise, and anterior tibial pressure is decreased from previous      study; however, posterior tibial is stable, and anterior tibial      does still show increase from initial preoperative study.   Patient      states during previous study was in extreme pain during pressures,      which may have affected results.       ___________________________________________  Di Kindle. Edilia Bo, M.D.   AS/MEDQ  D:  02/04/2009  T:  02/04/2009  Job:  [id number]

## 2010-08-12 NOTE — H&P (Signed)
NAMEKATHIA, [name] NO.:  [id number]   MEDICAL RECORD NO.:  [id number]          PATIENT TYPE:  EMS   LOCATION:  MAJO                         FACILITY:  [facility]   PHYSICIAN:  Di Kindle. Edilia Bo, M.D.DATE OF BIRTH:  July 13, 1955   DATE OF ADMISSION:  01/12/2008  DATE OF DISCHARGE:                              HISTORY & PHYSICAL   REASON FOR ADMISSION:  Left left leg pain and paresthesias.   HISTORY:  This is a pleasant 55 year old woman who at approximately  10:30 tonight developed fairly sudden onset of left calf pain and pain  in the foot with paresthesias in the left foot.  She was seen in the  emergency department and initially the foot looked ischemic with no  Doppler flow, but later she had reasonable Doppler flow.  She was having  intermittent spells like this while in the emergency department and  ultimately the emergency room physician asked for vascular surgery  consultation.  She states she had one similar episode Sunday involving  the left leg.  She has had no previous history of claudication, rest  pain, or nonhealing ulcers.  She has no history of cardiac arrhythmias  that she is aware of.   PAST MEDICAL HISTORY:  Significant for hypertension and  hypercholesterolemia.  She denies any history of diabetes, history of  previous myocardial infarction, history of congestive heart failure,  history of emphysema.  She does have a history of asthma.   PAST SURGICAL HISTORY:  She has had previous arthroscopy in her left  knee for an ACL injury.   ALLERGIES:  PENICILLIN, CODEINE AND SULFA.   MEDICATIONS:  Hydrochlorothiazide 25 mg daily. Cholesterol medicine she  does not know the name. Wellbutrin XL 150 mg p.o. daily and aspirin 81  mg p.o. daily, Tylenol Arthritis p.r.n.   FAMILY HISTORY:  There is no history of premature cardiovascular  disease.   SOCIAL HISTORY:  She is married.  She has two children.  She quit  tobacco 3 days ago. She smokes  less than a pack per day since she was  20.   REVIEW OF SYSTEMS:  GENERAL:  She had no recent weight loss, weight gain  or problems with her appetite.  CARDIAC:  She has had no chest pain,  chest pressure, palpitations or arrhythmias.  PULMONARY:  She has had  asthma most recently 3 weeks ago.  She denies any bronchitis, asthma or  wheezing.  GI: She has had no recent change in her bowel habits and has  no history of peptic ulcer disease.  GU: She has no dysuria or  frequency. VASCULAR:  She has had no previous claudication, rest pain,  or nonhealing ulcers.  She had no history of DVT or phlebitis.  She has  had no leg swelling.  NEURO:  She has had no dizziness, blackouts,  headaches or seizures.  HEMATOLOGY:  She has had no bleeding problems or  clotting disorders.   PHYSICAL EXAMINATION:  GENERAL APPEARANCE:  This is a pleasant 55-year-  old one woman who appears her stated age.  VITAL SIGNS:  Temperature is 97.6, blood pressure 124/84, heart rate  105. She is moderately obese.  NECK:  Supple.  There is no cervical lymphadenopathy.  I do not detect  any carotid bruits.  LUNGS: Clear bilaterally to auscultation.  CARDIAC:  She has a regular rate and rhythm.  ABDOMEN: Soft, nontender.  She is obese and is difficult to assess for  an aneurysm.  She has normal pitched bowel sounds.  NEUROLOGICAL:  She has a palpable right femoral pulse and a diminished  left femoral pulse.  I cannot palpate popliteal or pedal pulse on the  left.  She does have palpable popliteal pulse on the right with brisk  Doppler flow in the right foot. On the left side she has a peroneal  signal with the Doppler. I cannot get a posterior tibial or dorsalis  pedis signal, although according to her ER records she has had  intermittent signals here.  The left foot is slightly dusky compared to  the right.  Motor and sensory function of the left foot slightly  decreased.   LABORATORY RESULTS:  Laboratory evaluation  shows a normal creatinine.  Coags are pending.   IMPRESSION:  This patient presents with the fairly sudden onset of calf  pain on the left with paresthesias in the left foot.  She has a  diminished femoral pulse, and I suspect she has a proximal iliac or  stenosis which could have potentially resulted in an embolic event.  I  have recommended we admit her and place her on heparin with plans for  arteriography tomorrow.  Hopefully, if she has an iliac stenosis  amenable to angioplasty, this could be addressed from an endovascular  standpoint, which we could not do in the OR.  I have explained that I  would be reluctant to proceed with embolectomy without preoperative  arteriogram as I think she may very well have a proximal stenosis which  would be difficult to assess and address in the operating room.  She  does have flow in the peroneal artery in the foot.  Will make further  recommendations pending the results of her arteriogram.      Di Kindle. Edilia Bo, M.D.  Electronically Signed     CSD/MEDQ  D:  01/12/2008  T:  01/12/2008  Job:  [id number]

## 2010-08-12 NOTE — Assessment & Plan Note (Signed)
OFFICE VISIT   [name], [name] A  DOB:  1955-07-09                                       03/27/2008  GNFAO#:[id number]   I saw the patient in the office today for continued followup of her open  left fourth ray amputation site.  She does the dressing changes herself  with dry gauze and the wound has dried out considerably.  I really do  not see any significant granulation.  I think the wound is gradually  getting worse.  Despite her tibial occlusion she does have reasonable  Doppler flow in the posterior tibial and dorsalis pedis and peroneal  positions on the left.  She has had a previous arteriogram which shows  no options for revascularization.  I have explained that there are three  options.  One is continued dressing changes.  We can try Silvadene to  soften up the eschar enough that I can debride it some although I made  it clear to her that I do not think this is going to heal.  Number two  would be attempted transmetatarsal amputation with a high risk of  nonhealing and three would be below the knee amputation.  I think her  best chance to returning to ambulation and her normal activities would  proceed with below the knee amputation and get fitted for a prosthesis  as soon as possible and began rehab.  However, she is very much opposed  to this currently.  I will see her back in 2 weeks and we will continue  to follow this wound closely.   Di Kindle. Edilia Bo, M.D.  Electronically Signed   CSD/MEDQ  D:  03/27/2008  T:  03/28/2008  Job:  [id number]

## 2010-08-12 NOTE — Assessment & Plan Note (Signed)
OFFICE VISIT   [name], [name] A  DOB:  03/07/1956                                       04/10/2008  ZOXWR#:[id number]   I saw the patient for continued followup of her left foot wound.  She  had presented with an acute onset of left leg pain and underwent left  iliofemoral embolectomy, vein patch angioplasty of the left femoral  artery, tibial embolectomy with vein patch angioplasty of the tibial  peroneal trunk.  On 01/12/2008 she underwent an aortogram with runoff  and this demonstrated patent tibial vessels down to the mid calf with  the posterior tibial and peroneal artery were occluded.  The anterior  tibial was patent in the ankle and was occluded.  She had amputation of  the left fourth toe and placement of a VAC, however, they had a hard  time keeping the [facility] on so this was discontinued.  She has been doing  dressing changes.  Despite having very reasonable Doppler flow in the  anterior tibial, posterior tibial and peroneal positions on the left  foot the wounds have continued to progress.  Based on her arteriogram I  really see no options for revascularization.   Today in the office the wound on the forefoot has clearly progressed  with dry gangrene but no erythema or drainage currently.  I have  explained that I think the options are either to have her evaluated by  Dr. Lajoyce Corners for possible transmetatarsal or signs amputation with moderate  risk of nonhealing given her distal tibial and small vessel disease.  Second option would be primary below-the-knee amputation.  She has had a  difficult time making this decision and I have offered her the option of  getting a second opinion which she is agreeable to.  We will arrange for  a second opinion next week here in our office.  If she elects to attempt  the forefoot amputation I think she would need referral to Dr. Lajoyce Corners.  If  she would prefer to proceed with primary amputation I would be happy to  arrange this.  Again, I do not see any role for hyperbaric oxygen and  saw no options for revascularization.  This appears to be mostly distal  tibial disease and small vessel disease.   Di Kindle. Edilia Bo, M.D.  Electronically Signed   CSD/MEDQ  D:  04/10/2008  T:  04/11/2008  Job:  [id number]

## 2010-08-12 NOTE — Discharge Summary (Signed)
NAMEMIRELY, PANGLE NO.:  [id number]   MEDICAL RECORD NO.:  [id number]          PATIENT TYPE:  INP   LOCATION:  5001                         FACILITY:  [facility]   PHYSICIAN:  Nadara Mustard, MD     DATE OF BIRTH:  Jun 10, 1955   DATE OF ADMISSION:  04/25/2008  DATE OF DISCHARGE:  05/01/2008                               DISCHARGE SUMMARY   FINAL DIAGNOSIS:  Gangrene, left forefoot.   PROCEDURE:  Left midfoot amputation.   CONDITION ON DISCHARGE:  Discharged to home in stable condition.   MEDICATIONS:  Prescription for Tylox for pain.   FOLLOWUP:  Follow up in the office in 2 weeks with [facility]  for Home Health nursing dressing changes.   HISTORY OF PRESENT ILLNESS:  The patient is a 55 year old woman with  gangrene of the left forefoot.  She has failed conservative care, has  had progressive breakdown of the foot and presents at this time for  midfoot amputation for attempted foot salvage.  Risks and benefits were  discussed.  The patient states she understands and wished to proceed at  this time.   HOSPITAL COURSE:  The patient's hospital course was essentially  unremarkable.  She underwent a left midfoot amputation on April 25, 2008.  Tourniquet was not used.  She was started on clindamycin for  infection prophylaxis.  Postoperatively, she was maintained on her  Coumadin.  Her INR remained therapeutic.  She did have a wound VAC  placed to help with healing of the wound.  PCA was used for pain  control.  The patient's wound showed good approximation, no ischemic  changes, and she was discharged to home after she completed a physical  therapy on May 01, 2008 with a followup in the office in 2 weeks.      Nadara Mustard, MD  Electronically Signed     MVD/MEDQ  D:  06/21/2008  T:  06/21/2008  Job:  [id number]

## 2010-08-12 NOTE — Assessment & Plan Note (Signed)
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  She has patent tibial vessels  down to the mid calf where the posterior tibial and peroneal are  occluded.  The anterior tibial is patent to the ankle and then occludes.  She had an amputation of the left fourth toe and placement of a vac  although they have hard time keeping the vac in place, so we went to  dressing changes.  She comes in for a wound check.  The webspace  adjacent to the fifth toe, this has progressed to a full-thickness wound  which I debrided some in the office today.  The perfusion the tissue  looks marginal, but I think she has some chance of healing this wound.  My plan will be to switch to wet to dry dressing changes twice a day by  the home health nurse with the hopes that we can clean this up enough to  get a vac back on here.  She could potentially require amputation of the  fifth toe.  Despite her distal tibial occlusion, she does have  reasonably brisk Doppler signals in the dorsalis pedis, posterior  tibial, and peroneal positions in the left foot.  As per her  arteriogram, there are no options for revascularization on the left.  Unfortunately, if this wound will not heal, she would require a below-  the-knee amputation.  We have discussed this before.  I plan on seeing  her back in 2 weeks to continue to follow her wound.  She knows to call  sooner if she has problems.  I have given her a prescription for 40  Tylox for pain.   Di Kindle. Edilia Bo, M.D.  Electronically Signed   CSD/MEDQ  D:  03/13/2008  T:  03/15/2008  Job:  [id number]

## 2010-08-12 NOTE — Discharge Summary (Signed)
[name], [name] NO.:  [id number]   MEDICAL RECORD NO.:  [id number]          PATIENT TYPE:  INP   LOCATION:  2041                         FACILITY:  [facility]   PHYSICIAN:  Di Kindle. Edilia Bo, M.D.DATE OF BIRTH:  02/23/1956   DATE OF ADMISSION:  02/27/2008  DATE OF DISCHARGE:  03/06/2008                               DISCHARGE SUMMARY   REASON FOR ADMISSION:  Rest pain in the left forefoot with dry gangrene  of the left fourth toe.   HISTORY:  This is a 55 year old woman who had presented on January 12, 2008, with an ischemic left lower extremity.  She underwent an  arteriogram, which showed some mild atherosclerotic disease of the  distal infrarenal aorta above the left common iliac artery that was  clotted and left common iliac artery, and she had also embolized to her  tibioperoneal trunk and distal tibial vessels.  She was taken to the  operating room and underwent iliofemoral thrombectomy with vein patch  angioplasty of the left common femoral artery.  She also had tibial  embolectomy with vein patch angioplasty of the tibioperoneal trunk.  She  had severe distal disease.  She did undergo a transesophageal echo,  which showed no cardiac source for embolization.  She was started on  Coumadin with plans to continue this for 3 months, and then we would  stop her Coumadin and re-study her to look for any potential source of  embolic disease.  She presented, however, with increasing rest pain in  the left foot and dry gangrene of the left fourth toe.  She was,  therefore, brought in for diagnostic arteriography after her Coumadin  was stopped.  The remainder of her medical history is as documented  without addition or deletion.   HOSPITAL COURSE:  The patient was admitted on February 27, 2008.  She  underwent aortogram with bilateral lower extremity runoff.  Her  arteriogram demonstrated mild atherosclerotic disease along the lateral  wall of her aorta on  the left with a slightly ulcerated plaque in the  distal infrarenal aorta just above the origin of her left common iliac  artery.  The left common iliac artery, external iliac, common femoral,  deep femoral, superficial femoral, and popliteal arteries were all  widely patent.  On the left side, the anterior tibial artery was  occluded just above the ankle.  The peroneal artery was occluded in the  mid leg, and the posterior tibial artery was occluded in the mid leg.  There were no distal vessels.  There were no options for  revascularization because of her severe tibial occlusive disease.  She  had only mild disease of the infrarenal aorta, and although this could  potentially be the source for embolization.  This could not be addressed  from an endovascular standpoint, as this would require manipulation on  the right side, which had no significant problems, and she was not an  ideal candidate for aortofemoral bypass grafting, especially given the  mild amount of disease noted.  She was taken to the operating room on  February 29, 2008, and underwent open ray amputation of  the left fourth  toe with placement of VAC.  We had problems with the [facility] maintaining  suction; and therefore, she was switched to dressing changes with  hydrogel.  She was maintained on Ancef throughout her hospital course.  The wound had some granulation and remained stable throughout her  hospitalization.  Perfusion was clearly marginal, but again there were  no options for revascularization.  Arrangements were made for home  health to do b.i.d. dressing changes with hydrogel.  She was restarted  on her Coumadin, which was therapeutic by March 06, 2008, with an INR  of 2.4 and she was discharged to home.  She is to have her Coumadin  followed in Dr. Lindwood Coke office on Friday, March 09, 2008, with a  goal of INR of 2-3.   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   PROCEDURES:  1. Aortogram, bilateral lower extremity runoff on February 27, 2008.  2. Open ray amputation, left fourth toe on February 29, 2008.   SECONDARY DIAGNOSES:  1. Hypertension.  2. Hypercholesterolemia.  3. History of obesity.  4. History of tobacco use.   DISCHARGE MEDICATIONS:  1. Dilaudid one 2 mg p.o. q.4 h. p.r.n. pain.  2. Coumadin 5 mg p.o. q.p.m.  3. Multivitamin 1 p.o. daily.  4. Albuterol inhaler 4 times a day p.r.n.  5. Fenofibrate 160 mg p.o. daily.  6. Singulair 210 mg p.o. daily.  7. Hydrochlorothiazide 25 mg p.o. daily.  8. Wellbutrin SR 150 mg p.o. daily.  9. Nasacort 55 mg p.o. daily.  10.Advair Diskus 250/50 one puff 2 times daily.  11.Ventolin p.r.n.  12.Symbicort 160/4.5 mg 1 puff 4 times daily.  13.Tylenol ER 500 mg 2 times daily p.r.n.  14.Benadryl 25 mg p.o. daily p.r.n.  15.Aspirin 81 mg p.o. daily.  16.Gabapentin 600 mg 3 times a day p.r.n.  17.Keflex 500 mg p.o. t.i.d.   Condition on discharge is good.   Discharge is to home.   Followup was with Dr. Edilia Bo in 1 week.      Di Kindle. Edilia Bo, M.D.  Electronically Signed     CSD/MEDQ  D:  03/06/2008  T:  03/06/2008  Job:  [id number]   cc:   Tammy R. Collins [place], M.D.

## 2010-08-12 NOTE — Op Note (Signed)
Emily, Ramos NO.:  [id number]   MEDICAL RECORD NO.:  [id number]          PATIENT TYPE:  INP   LOCATION:  5001                         FACILITY:  [facility]   PHYSICIAN:  Nadara Mustard, MD     DATE OF BIRTH:  1955/05/23   DATE OF PROCEDURE:  04/25/2008  DATE OF DISCHARGE:                               OPERATIVE REPORT   PREOPERATIVE DIAGNOSIS:  Gangrene left forefoot.   POSTOPERATIVE DIAGNOSIS:  Gangrene left forefoot.   PROCEDURE:  Left midfoot amputation.   SURGEON:  Nadara Mustard, MD   ANESTHESIA:  General.   ESTIMATED BLOOD LOSS:  Minimal.   ANTIBIOTICS:  Clindamycin 600 mg IV.   DRAINS:  None.   COMPLICATIONS:  None.   TOURNIQUET TIME:  None.   DISPOSITION:  To PACU in stable condition.   INDICATIONS FOR PROCEDURE:  The patient is a 55 year old woman with  gangrene of the left forefoot.  She has severe peripheral vascular  disease.  She is status post revascularization not felt to be a  candidate for revascularization and presents at this time for foot  salvage surgery.  Risks and benefits were discussed including persistent  infection, nonhealing of the wound, potential for a transtibial  amputation.  The patient states she understands and wishes to proceed  with attempted foot salvage surgery versus proceeding with a transtibial  amputation.   DESCRIPTION OF PROCEDURE:  The patient was brought to OR room 15 and  underwent a general anesthetic.  After adequate level of anesthesia  obtained, the patient's left lower extremity was prepped using DuraPrep  and draped into a sterile field.  The gangrenous forefoot was draped out  with Ioban dressing.  A fish-mouth incision was made and a  transmetatarsal amputation was performed.  The wound was irrigated with  normal saline.  There was good petechial bleeding and good muscle  bleeding.  The wound was closed without tension on the skin with a far-  near-near-far suture using 2-0 nylon.  A  wound VAC was applied to the  wound and this was set for -75 mmHg.  The patient was admitted.  She  will continue on the clindamycin, continue on the wound VAC, start with  nitroglycerin patch and discharge once she is safe with ambulation and  nonweightbearing on the left.      Nadara Mustard, MD  Electronically Signed     MVD/MEDQ  D:  04/25/2008  T:  04/25/2008  Job:  [id number]

## 2010-08-12 NOTE — Op Note (Signed)
NAMENEALIE, MCHATTON NO.:  [id number]   MEDICAL RECORD NO.:  [id number]          PATIENT TYPE:  INP   LOCATION:  5007                         FACILITY:  [facility]   PHYSICIAN:  Nadara Mustard, MD     DATE OF BIRTH:  December 13, 1955   DATE OF PROCEDURE:  05/31/2008  DATE OF DISCHARGE:                               OPERATIVE REPORT   PREOPERATIVE DIAGNOSIS:  Gangrene with wound dehiscence, left foot,  status post midfoot amputation.   POSTOPERATIVE DIAGNOSIS:  Gangrene with wound dehiscence, left foot,  status post midfoot amputation.   PROCEDURE:  1. Revision left midfoot amputation.  2. Application of wound VAC.   SURGEON:  Nadara Mustard, MD   ANESTHESIA:  General.   ESTIMATED BLOOD LOSS:  Minimal.   ANTIBIOTICS:  Clindamycin 600 mg IV.   DRAINS:  None.   COMPLICATIONS:  None.   TOURNIQUET TIME:  None.   DISPOSITION:  To PACU in stable condition.   INDICATIONS FOR PROCEDURE:  The patient is a 55 year old woman with  severe peripheral vascular disease.  She has undergone a vascular  evaluation as well as the midfoot amputation.  She has developed  progressive gangrenous changes over the lateral aspect of the incision  due to pain, progressive gangrenous changes, and exposed bone.  The  patient presents at this time for revision of left midfoot amputation.  Risks and benefits were discussed including infection, neurovascular  injury, persistent pain, wound not healing, need for additional surgery.  The patient states she understands and wished to proceed at this time.   DESCRIPTION FOR PROCEDURE:  The patient was brought to OR room-4 and  underwent a general anesthetic.  Her foot was then cleansed using  Hibiclens, dried and then prepped using DuraPrep and draped into a  sterile field.  The sutures were removed.  The necrotic wound was  ellipsed out.  The metatarsals were transected through their base, bone  and soft tissue was removed from the field.   The wound was irrigated  with normal saline.  The wound edges closed well without any tension on  the skin and the wound was closed using 2-0 nylon with a far-near-near-  far suture.  The wound was then covered with the wound VAC set to minus  75 mmHg continuous suction.  This had a good suction fit and she was  then extubated, taken to PACU in stable condition.   PLAN FOR ADMISSION:  Nitroglycerin patch, Medinex p.o., wound VAC, and  possible discharge in several days with IV clindamycin.      Nadara Mustard, MD  Electronically Signed     MVD/MEDQ  D:  05/31/2008  T:  06/01/2008  Job:  [id number]

## 2010-08-12 NOTE — Procedures (Signed)
AORTA-ILIAC DUPLEX EVALUATION   INDICATION:  Postop evaluation of lower extremity, arterial.   HISTORY:  Diabetes:  No.  Cardiac:  No.  Hypertension:  Yes.  Smoking:  Previous.  Previous Surgery:  Thrombectomy of left CIA, PTA, and peroneal artery by  Dr. Edilia Bo on 01/12/08.               SINGLE LEVEL ARTERIAL EXAM                              RIGHT                  LEFT  Brachial:                  131                    125  Anterior tibial:           136                    152  Posterior tibial:          139                    124  Peroneal:  Ankle/brachial index:      1.06                   1.16  Previous ABI/date:   AORTA-ILIAC DUPLEX EXAM  Aorta - Proximal     82 cm/s  Aorta - Mid          93 cm/s  Aorta - Distal       129 cm/s   RIGHT                                   LEFT                    CIA-PROXIMAL          118 cm/s                    CIA-DISTAL            93 cm/s                    HYPOGASTRIC           104 cm/s                    EIA-PROXIMAL          84 cm/s                    EIA-MID               118 cm/s                    EIA-DISTAL            126 cm/s   IMPRESSION:  1. Patent left common iliac and peroneal arteries with no evidence of      focal stenosis.  2. Normal bilateral lower extremity ankle brachial indices with      triphasic and biphasic Doppler waveforms.   ___________________________________________  Di Kindle. Edilia Bo, M.D.   AC/MEDQ  D:  07/23/2008  T:  07/23/2008  Job:  [id number]

## 2010-08-12 NOTE — Assessment & Plan Note (Signed)
OFFICE VISIT   TERRINA, DOCTER A  DOB:  07/16/1955                                       02/21/2008  [id number]#:[id number]   I saw the patient in the office today for continued followup of her left  foot.  She had presented with the sudden onset a left calf pain and  paresthesias in the left foot.  She was found to have diminished femoral  pulse on the left with no obvious cardiac reason for embolization.  She  underwent a preoperative arteriogram which showed a clot in the left  common iliac artery.  She subsequently was taken to the operating room  and had left iliofemoral embolectomy with vein patch angioplasty left  common femoral artery, tibial embolectomy with vein patch angioplasty to  the tibial peroneal trunk, and intraoperative arteriogram.  She had a  TEE which is unremarkable and with no obvious cardiac source for  embolization.  My plan was to continue Coumadin for 3 months at which  time we would discontinue it and repeat her arteriogram to look for  proximal iliac lesion which could explain her embolic event.  She comes  in today because she is having increasing pain in the left foot and the  toes, and she developed as a small blister at the base of the fourth toe  on the dorsum of the left foot.  She had no fever or chills.  She had  been taking OxyContin for pain.   EXAMINATION:  Blood pressure is 117/76, heart rate is 103, temperature  is 98.1.  She has palpable femoral pulses.  She has brisk Doppler  signals in the left foot in the peroneal dorsalis pedis, anterior  tibial, and posterior tibial positions.  The fourth toe is slightly  erythematous.  The end of the foot is slightly cool.   I suspect she has distal embolic disease of the foot which is explaining  her pain.  She has good flow down to the ankle.  I have written a  prescription for Keflex given the mild cellulitis.  Obviously, she will  not be able to return to work as she  cannot put any weight on her foot  at this time.  I think it is best that we proceed with her arteriogram  now, so we will stop her Coumadin today and plan on proceeding with  arteriography on 02/27/2008.  I will stick the right side to evaluate  her left common iliac stenosis and if there are stenoses amenable to  angioplasty, we will potentially also stick the left side.  Will make  further recommendations pending the results of her arteriogram.  We have  discussed the indications for arteriography and the potential  complications including but not limited to renal insufficiency, arterial  injury, or bleeding.  We have also discussed the potential complications  of angioplasty and stenting including but not limited to bleeding,  arterial thrombosis or arterial injury.  All her questions were  answered.  She is agreeable to proceed.   Di Kindle. Edilia Bo, M.D.  Electronically Signed   CSD/MEDQ  D:  02/21/2008  T:  02/22/2008  Job:  [id number]

## 2010-08-12 NOTE — Op Note (Signed)
[name], [name]         ACCOUNT NO.:  [id number]   MEDICAL RECORD NO.:  [id number]          PATIENT TYPE:  INP   LOCATION:  3303                         FACILITY:  [facility]   PHYSICIAN:  Di Kindle. 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   PROCEDURE:  1. Aortogram.  2. Bilateral lower extremity runoff.  3. Ultrasound-guided access to the left common femoral artery.   INDICATIONS:  This is a 55 year old woman who presented to the emergency  department with some pain in her left calf and some paresthesias in the  left foot.  In the emergency department, she intermittently had  reasonable Doppler flow in the foot.  On my exam, however, she had a  diminished femoral pulse on the left with a peroneal signal with a  Doppler.  It was felt that she likely had a proximal iliac artery  stenosis that had embolized and arteriography was recommended.  Of note,  she had no real reason to have had a cardiac source of embolization.  She was brought for diagnostic arteriography and possible left iliac  angioplasty.   TECHNIQUE:  The patient was taken to the [facility] and sedated with 0.5 mg  of Versed and 25 mcg of fentanyl.  After the skin was anesthetized with  a 1% lidocaine and under ultrasound guidance, the left common femoral  artery was cannulated and guidewire was introduced in the infrarenal  aorta under fluoroscopic control.  The 5-French sheath was then  introduced over the wire.  Next, a pigtail catheter was advanced through  the L1 vertebral body and flush aortogram was obtained.  The catheter  was then repositioned above the aortic bifurcation, and oblique iliac  projections were obtained.   Bilateral lower extremity runoff films were  then obtained through the pigtail catheter.  Next, the pigtail catheter  was removed over wire and additional spot films were obtained of the  left leg.   FINDINGS:  There were 2 renal arteries on the right and single renal  artery on the left.  No significant renal artery stenosis is identified.  The infrarenal aorta is widely patent.  On the left side which is the  symptomatic side, there is a what appears to be thrombus in the proximal  left common iliac artery.  There appears to be some plaque in the distal  aorta on the left above this thrombus, but it is difficult to visualize  and there does not appear to be significantly limiting.  Beyond the  thrombus in the left common iliac artery, the common iliac, hypogastric,  and external iliac arteries are patent.  The common femoral, superficial  femoral, deep femoral, and popliteal arteries are patent.  There is an  abrupt occlusion of the tibioperoneal trunk.  The anterior tibial is  patent, although small in size distally.  The posterior tibial  and  peroneal are occluded.   On the right side, the common iliac, external iliac, and hypogastric  arteries are patent.  The common femoral, deep femoral, superficial  femoral, popliteal, and tibial vessels are patent on the right, although  there was poor visualization of the tibialis distally on the right.  However, there was no evidence of embolic disease on the right.  At the  completion of the procedure, the sheath was removed.  Pressure was held  for hemostasis and there were no immediate complications noted.   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      Di Kindle. Edilia Bo, M.D.  Electronically Signed     CSD/MEDQ  D:  01/12/2008  T:  01/13/2008  Job:  [id number]

## 2010-08-12 NOTE — Discharge Summary (Signed)
NAMEPRESLEA, RHODUS NO.:  [id number]   MEDICAL RECORD NO.:  [id number]          PATIENT TYPE:  INP   LOCATION:  2002                         FACILITY:  [facility]   PHYSICIAN:  Di Kindle. Edilia Bo, M.D.DATE OF BIRTH:  [birth year]   DATE OF ADMISSION:  01/12/2008  DATE OF DISCHARGE:  01/19/2008                               DISCHARGE SUMMARY   REASON FOR ADMISSION:  An ischemic left leg.   HISTORY:  This is a pleasant 55 year old woman who developed the sudden  onset of left calf pain and paresthesias in the left foot.  She was seen  in the emergency department and felt to have an ischemic foot, Vascular  Surgery was consulted.  On my exam, she had a peroneal signal with the  Doppler only.  She had a diminished femoral pulse on left with a normal  right femoral pulse.  She was brought in for arteriography and attempted  revascularization.   Her past medical history is significant for hypertension and  hypercholesterolemia.  She denies any history of diabetes, history of  previous myocardial infarction, history of congestive heart failure.  The remainder of her history and physical is as dictated without  addition or deletion.   HOSPITAL COURSE:  The patient was admitted on January 12, 2008.  She was  placed on heparin.  She underwent an arteriogram on January 12, 2008,  which showed a filling defect in the proximal left common iliac artery  consistent with clot.  She had a patent femoral popliteal system but  tibial occlusive disease with occlusion of the tibioperoneal trunk and  distal anterior tibial artery.  On January 12, 2008, she underwent a  left iliofemoral embolectomy with vein patch angioplasty of the left  common femoral artery.  She had also tibial embolectomy with vein patch  angioplasty of the left tibioperoneal trunk.  Completion arteriogram  showed that the anterior tibial artery was patent proximally but was  poorly visualized distally, the  posterior tibial artery was severely  spasmed.  In reviewing the operative report, the Fogarty catheter passed  approximately 20 cm down the peroneal and posterior tibial arteries.  Postoperatively, it should also be noted that a large amount of clot was  retrieved proximally from the iliac artery.  The pathology on this was  simply organized clot.  She did have postoperative ABIs done on January 13, 2008, which showed an ABI on the left of 71% with monophasic signals  in the posterior tibial position and biphasic signals in the anterior  tibial position.  She was started on heparin and then converted to  Coumadin.  Of note, I did obtain a transthoracic echo which showed no  obvious cardiac source of clot.  For this reason, I had her have a TEE  performed which again showed no significant cardiac source for clot.  Her EF was estimated 55-60%.  Once her Coumadin was therapeutic on  postoperative day #7, it was felt that she was ready for discharge.  She  had an anterior tibial and peroneal signal with the Doppler.  Her INR  was 2.3.   PLAN:  The  plan was to maintain her on Coumadin for 3 months.  I have  discussed this with Dr. Yehuda Budd who will follow her Coumadin as an  outpatient.  She will follow up on Monday to have her pro time checked.  I would likely stop her Coumadin in 3 months and obtain a  hypercoagulable workup and then possibly repeat her arteriogram to look  for a proximal iliac lesion, which might have explained her thrombotic  event given that her TEE was negative.  I will see her followup will be  in 5 days for staple removal.   MEDICATIONS ON DISCHARGE:  1. Hydrochlorothiazide 25 mg p.o. daily.  2. Aspirin 81 mg p.o. daily.  3. Wellbutrin XL 150 mg p.o. daily.  4. Coumadin 5 mg p.o. daily nightly.  5. Neurontin 300 mg p.o. b.i.d.  6. Percocet 5/325 one to two q.4 h. p.r.n. pain.   Condition on discharge is good.   DISCHARGE DIAGNOSIS:  Ischemic left lower  extremity with left iliac  thrombus and tibial thrombus.   PROCEDURES:  1. Ultrasound-guided access to the left common femoral artery.  2. Aortogram with bilateral lower extremity runoff and that was on      January 12, 2008.  3. On January 12, 2008, left iliofemoral embolectomy with vein patch      angioplasty of the left common femoral artery and tibial      embolectomy with vein patch angioplasty of the left tibioperoneal      trunk.   Discharge is to home.  Followup with her Coumadin is with Dr. Yehuda Budd on  January 23, 2008, and I will see her on January 24, 2008, for staple  removal.      Di Kindle. Edilia Bo, M.D.  Electronically Signed     CSD/MEDQ  D:  01/19/2008  T:  01/19/2008  Job:  [id number]   cc:   Tammy R. Collins [place], M.D.

## 2010-08-12 NOTE — Op Note (Signed)
[name], [name]         ACCOUNT NO.:  [id number]   MEDICAL RECORD NO.:  [id number]          PATIENT TYPE:  INP   LOCATION:  3303                         FACILITY:  [facility]   PHYSICIAN:  Di Kindle. Edilia Bo, M.D.DATE OF BIRTH:  [birth year]   DATE OF PROCEDURE:  01/12/2008  DATE OF DISCHARGE:                               OPERATIVE REPORT   PREOPERATIVE DIAGNOSIS:  Ischemic left lower extremity with embolic  disease to the left iliac artery and left leg.   POSTOPERATIVE DIAGNOSIS:  Ischemic left lower extremity with embolic  disease to the left iliac artery and left leg.   PROCEDURE:  1. Left iliofemoral embolectomy.  2. Vein patch angioplasty of the left common femoral artery.  3. Tibial embolectomy.  4. Vein patch angioplasty of the tibioperoneal trunk.  5. Intraoperative arteriogram.   SURGEON:  Di Kindle. Edilia Bo, MD   ASSISTANT:  Jerold Coombe, PA   ANESTHESIA:  General.   INDICATIONS:  This is a 55 year old woman who had presented with acute  onset of left leg pain.  On exam, she was noted have a diminished  femoral pulse and peroneal flow with a Doppler.  She underwent an  arteriogram, which showed filling defect in the proximal left common  iliac artery consistent with a thrombus.  There also appeared to be some  mild plaque at the aortic bifurcation on the left side.  In addition,  she embolized to her tibioperoneal trunk and the peroneal and posterior  tibial arteries were occluded on her arteriogram.  She is brought in for  iliofemoral embolectomy and tibial embolectomy.   TECHNIQUE:  The patient was taken to the operating room and received a  general anesthetic.  Her pannus was taped superiorly.  An oblique  incision was made in the left groin above the inguinal crease and  through this incision the common femoral, superficial femoral, and deep  femoral arteries were dissected free, controlled with vessel loops.  I  then made a longitudinal  incision to the medial aspect of the left leg  and a segment of the saphenous vein, which was fairly small, was  dissected out and ligated proximally and distally, was opened  longitudinally to be used as a vein patch.  The below-knee popliteal  artery was exposed.  The anterior tibial vein was divided between 2-0  silk ties allowing exposure of the tibioperoneal trunk and its  bifurcation into the posterior tibial artery and peroneal artery.  The  patient was then heparinized.  A longitudinal arteriotomy was made in  the common femoral artery and the three and four Fogarty were passed  proximally and some organized thrombus was retrieved.  Excellent inflow  was established.  The specimen was sent to pathology.  There was some  slight irregularity in passing the catheter through the proximal common  iliac artery suggesting possibly some plaque here.  Next, the  tibioperoneal trunk was opened and a 3-Fogarty catheter passed directly  down into the posterior tibial and peroneal artery where large amount of  clot was retrieved from both of these vessels.  The catheter passed  approximately 20 cm down each  of these vessels.  The anterior tibial was  patent by arteriogram proximally.  A segment of vein was used and I did  pass the three and four Fogarty catheter proximally until no further  clot was retrieved.  The tibioperoneal trunk was patched with the vein  patch using deep running 6-0 Prolene suture.  At the completion, there  was a good anterior tibial signal at the Doppler and also a peroneal  signal, and a faint posterior tibial signal also. The foot appeared  adequate perfused.  The calf was soft.  The 15 Blake drains were placed  in each incision, so that the patient could be heparinized.  The groin  incision was closed with a deep layer of 2-0 Vicryl, the subcutaneous  layer with a 2-0 Vicryl, and the skin closed with a 4-0 subcuticular  stitch.  The below-the-knee incision was  closed with a deep layer of 3-0  Vicryl and then the skin was closed with staples.  A sterile dressing  was applied.  The patient tolerated the procedure well and was  transferred to the recovery room in satisfactory condition.  All needle  and sponge counts were correct.      Di Kindle. Edilia Bo, M.D.  Electronically Signed     CSD/MEDQ  D:  01/12/2008  T:  01/13/2008  Job:  [id number]

## 2010-08-12 NOTE — Assessment & Plan Note (Signed)
OFFICE VISIT   [name], [name]  DOB:  1955-12-21                                       12/13/2008  UVOZD#:[id number]   I saw the patient in the office today concerning left leg pain.  This is  Ramos pleasant 55 year old woman who had presented with an ischemic left  lower extremity secondary to embolic disease in October of 2009.  She  underwent left iliofemoral embolectomy with vein patch angioplasty of  the femoral artery in addition to tibial embolectomy and vein patch  angioplasty of the tibial peroneal trunk.  She ultimately underwent  forefoot amputation by Dr. Lajoyce Corners and has done very well from that  standpoint.  Approximately 2 weeks ago she developed pain over her tibia  on the left leg and the pain was initially fairly severe but has now  gradually subsided.  She does not remember any specific injury to this  area and did not notice any discoloration to the leg.  She was evaluated  I believe in the emergency department and had good Doppler flow in her  foot and plain x-ray of the bone showed no evidence of fracture.  Her  symptoms have been gradually improving over the last week.   PAST MEDICAL HISTORY:  Significant for hypertension and  hypercholesterolemia.  She denies any history of diabetes.   REVIEW OF SYSTEMS:  She has had no fever or chills.  She has had no  chest pain or significant shortness of breath.   PHYSICAL EXAMINATION:  Blood pressure is 114/75, heart rate is 94.  Lungs are clear bilaterally to auscultation.  On cardiac exam she has Ramos  regular rate and rhythm.  She has palpable femoral pulses although the  left femoral pulse is slightly diminished.  She has palpable dorsalis  pedis pulse in the right foot.  On the left side she has Ramos monophasic  peroneal dorsalis pedis in posterior tibial position with the Doppler.  The TMA is warm and well-perfused.  She has no tenderness over her  anterior lateral compartment.  Her tenderness  is mostly over the tibia  itself.   I have reassured her I do not think her pain was attributed to ischemia.  Likewise this appeared to be mostly pain over the bone and did not  appear to be muscle pain in the anterior lateral compartment.  She has  no significant swelling in the leg to suggest Ramos DVT or compartment  problem.   I again reassured her that she had good blood flow and she will follow  up with Dr. Lajoyce Corners concerning the pain in her tibia to determine if any  further diagnostic tests are warranted.  I plan on seeing her back in 6  months.  She knows to call sooner if she has problems.   Di Kindle. Edilia Bo, M.D.  Electronically Signed   CSD/MEDQ  D:  12/13/2008  T:  12/14/2008  Job:  [id number]   cc:   Nadara Mustard, MD

## 2010-08-12 NOTE — Op Note (Signed)
NAMEMERIN, BORJON NO.:  [id number]   MEDICAL RECORD NO.:  [id number]          PATIENT TYPE:  INP   LOCATION:  2016                         FACILITY:  [facility]   PHYSICIAN:  Di Kindle. 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   PROCEDURE:  Open ray amputation of left fourth toe with placement of  VAC.   SURGEON:  Di Kindle. Edilia Bo, MD   ASSISTANT:  Nurse.   ANESTHESIA:  General.   TECHNIQUE:  The patient was taken to the operating room and received a  general anesthetic.  The left foot was prepped and draped in usual  sterile fashion.  A tennis racket incision was made encompassing the  fourth toe and the dissection carried down to the metatarsal, which was  then divided.  The hemostasis was obtained using electrocautery.  The  wound was irrigated with copious amounts of saline.  There was no  evidence of active infection.  Bleeding appeared reasonable.  The  patient had known severe distal tibial occlusive disease with no options  for revascularization.  As I did not want to create any tension on the  wound, which might further compromise the circulation, I elected to  place a VAC.  VAC was placed and connected to -125 cm of suction.  The  patient tolerated the procedure well and was transferred to recovery  room in satisfactory condition.  All needle and sponge counts were  correct.      Di Kindle. Edilia Bo, M.D.  Electronically Signed     CSD/MEDQ  D:  02/29/2008  T:  03/01/2008  Job:  [id number]

## 2010-08-12 NOTE — H&P (Signed)
NAMEGOLA, BRIBIESCA NO.:  [id number]   MEDICAL RECORD NO.:  [id number]          PATIENT TYPE:  INP   LOCATION:  2807                         FACILITY:  [facility]   PHYSICIAN:  Di Kindle. Edilia Bo, M.D.DATE OF BIRTH:  10/08/1955   DATE OF ADMISSION:  02/27/2008  DATE OF DISCHARGE:                              HISTORY & PHYSICAL   REASON FOR ADMISSION:  Rest pain of the left forefoot with severe tibial  occlusive disease.   HISTORY:  This is a pleasant 55 year old woman who had presented on  January 12, 2008 with an ischemic left lower extremity.  She underwent  arteriogram which showed some mild atherosclerotic disease of the distal  infrarenal aorta just above the left common iliac artery.  There was  clot in the left common iliac artery and she had embolized to her tibial  peroneal trunk and distal tibials.  She was taken to the operating room  and underwent iliofemoral thrombectomy with vein patch angioplasty of  the left common femoral artery.  In addition, she had a tibial  embolectomy with vein patch angioplasty of the tibial peroneal trunk and  intraoperative arteriogram.  She underwent a transesophageal echo during  this admission, which showed no evidence of cardiac source for  embolization.  She was started on Coumadin with the plans to discontinue  her Coumadin in 3 months and then restudy her to further evaluate any  potential proximal iliac disease which might have been missed because of  the proximal clot.  She had been doing reasonably well until she  presented to the office on February 21, 2008 with increasing pain in the  left foot, specifically the left fourth toe which had become slightly  discolored and then developed a slight blister at the base the toe.  Her  Coumadin was discontinued and we elected to proceed sooner with  arteriography.  She was admitted on February 27, 2008 for an arteriogram  and at that point was having significant rest  pain in the left foot and  she was admitted for pain control.  Of note, prior to this inciting  event in October, she had no significant claudication rest pain or  nonhealing ulcers.   PAST MEDICAL HISTORY:  Significant for hypertension and  hypercholesterolemia.  In addition she has obesity.  She denies any  history of diabetes, history of previous myocardial infarction, history  of congestive heart failure or history of emphysema, although she does  have asthma.   PAST SURGICAL HISTORY:  Significant for previous arthrectomy in the left  knee for an ACL injury and also her vascular procedure as described  above.   ALLERGIES:  PENICILLIN, CODEINE and SULFA.   MEDICATIONS:  1. Multivitamin 1 p.o. daily.  2. Albuterol nebulizer 4 times daily p.r.n.  3. Fenofibrate 160 mg p.o. daily.  4. Singulair 210 mg daily.  5. Hydrochlorothiazide 25 mg p.o. daily.  6. Wellbutrin SR 150 mg daily.  7. Nasacort 55 mg daily.  8. Advair Diskus 250/50 one puff 2 times daily.  9. Ventolin p.r.n.  10.Symbicort 160/4.5 mg 1 puff 4 times daily.  11.Tylenol ES 500 mg  2 times daily.  12.Benadryl 25 mg p.o. daily.  13.Aspirin 81 mg p.o. daily.  14.Gabapentin 600 mg 2 to 3 times a day p.r.n.  15.Coumadin 5 mg at bedtime.  This was discontinued last week.  16.Cephalexin 500 mg 3 times daily.  17.Black cohosh 160 mg 2 times daily.   FAMILY HISTORY:  There is no history of premature cardiovascular disease  that she is aware of.   SOCIAL HISTORY:  She is married.  She has 2 children.  She quit tobacco  in early October of this year.  She had smoked less than a pack per day  since she was 20.   REVIEW OF SYSTEMS:  GENERAL:  She had no recent weight loss, weight gain  or problems with her appetite.  CARDIAC:  She has had no chest pain,  chest pressure, palpitations or arrhythmias.  PULMONARY:  She had asthma  and is on multiple inhalers for this.  She has had no recent bronchitis  or wheezing.  GI:  She  had no recent change of bowel habits and has no  history of peptic ulcer disease.  GU:  She had no dysuria or frequency.  VASCULAR:  She had rest pain in the left forefoot.  No claudication.  She has developed a black discoloration of the left fourth toe.  She had  no history of DVT or phlebitis.  NEURO:  She had no dizziness,  blackouts, headaches or seizures.  HEMATOLOGIC:  She had no bleeding  problems or clotting disorders.   PHYSICAL EXAMINATION:  CONSTITUTIONAL:  This is a pleasant 55 year old  woman who appears her stated age.  She is obese.  VITAL SIGNS:  Temperature is 98.4, blood pressure is 152/78, heart rate  is 98.  NECK:  Supple.  There is no cervical lymphadenopathy.  I do not detect  any carotid bruits.  LUNGS:  Clear bilaterally to auscultation.  CARDIAC:  She has a regular rate and rhythm.  ABDOMEN:  Soft and nontender.  She is moderately obese.  It is difficult  to assess for an aneurysm.  She has normal-pitched bowel sounds.  VASCULAR:  She has palpable femoral pulses.  I cannot palpate pedal  pulses although she does have reasonable Doppler signals in both feet in  the anterior tibial and posterior tibial positions.  The left foot has  mild tenderness and forefoot.  The left fourth toe is black with some  blistering at the base of the toe on the dorsum of the foot.  There is  no significant erythema or drainage.   LABORATORY EVALUATION:  Reveals INR 1.1.  Creatinine was normal.   Arteriogram today demonstrates mild atherosclerotic disease along the  lateral wall of the aorta on the left with a slightly ulcerative plaque  in the distal infrarenal aorta just above the origin of the left common  iliac artery.  The left common iliac artery, external iliac artery,  common femoral, deep femoral, superficial femoral and popliteal arteries  are all widely patent.  On the left, the anterior tibial artery is  occluded to just above the ankle.  The peroneal artery is  occluded in  the middle mid leg.  The posterior tibial artery is occluded in the mid  leg.  There are no named vessels distally, although there are extensive  collaterals in the foot.  On the right side, the common iliac, external  iliac, common femoral, superficial femoral, popliteal and proximal  tibial vessels were all widely patent.  There is poor visualization of  the tibials distally although the posterior tibial artery is patent to  the ankle.   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  The  patient does have some mild atherosclerotic disease of the distal  infrarenal aorta although this does not appear to be flow-limiting.  This was likely the source of her embolization.  I did not think it  would be wise to address this from an endovascular standpoint as  addressing the distal aortic mild atherosclerotic plaque on the left  would require using a kissing balloon technique and recreating the  bifurcation significantly higher.  The right side looks normal and I was  reluctant to consider intervention on the right, as currently this leg  is asymptomatic.  In addition, because of her obesity, I do not think  she is a good candidate for aortofemoral bypass grafting and this would  not improve her pain in the foot, but would lower her risk of future  embolic events.  The anatomy seen distally on the left is the same as it  was back in October.  I think she simply has not reconstructable distal  disease causing rest pain of the foot.  There are no options for  revascularization on the left which would impact the circulation of the  left foot or relieve her pain.  She does feel that the majority of her  pain is in the left fourth toe.  For this reason I think the best  initial approach would be amputation of left fourth toe, although  certainly there is risk of nonhealing and this may not totally  resolve  her pain, as I think she is having pain in the forefoot related to  ischemia.  If this did not heal, unfortunately, I think she would  require a below-the-knee amputation.  I have discussed this with the  patient's husband, son, and mother and the patient herself.  They seem  to understand and all their questions were answered.  She will be  admitted for pain control and we will use a high dose morphine PCA in  addition to Versed as needed.  We will tentatively plan surgery with  amputation of left fourth toe February 29, 2008.      Di Kindle. Edilia Bo, M.D.  Electronically Signed     CSD/MEDQ  D:  02/27/2008  T:  02/27/2008  Job:  [id number]   cc:   Tammy R. Collins [place], M.D.

## 2010-08-15 NOTE — Discharge Summary (Signed)
[place]. [facility]  Patient:    [name], [name]                MRN: [id number] Adm. Date:  [id number] Disc. Date: [id number] Attending:  Phifer, Harriett Sine [place] Dictator:   Vear Clock, M.D.                           Discharge Summary  ADDENDUM  Hemoglobin A1C was checked and was found to be 5.1. DD:  11/03/00 TD:  11/05/00 Job: [id number]
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
## 2010-12-29 LAB — HEPARIN LEVEL (UNFRACTIONATED)
Heparin Unfractionated: 0.2 — ABNORMAL LOW
Heparin Unfractionated: 0.22 — ABNORMAL LOW
Heparin Unfractionated: 0.29 — ABNORMAL LOW
Heparin Unfractionated: 0.34
Heparin Unfractionated: 0.45
Heparin Unfractionated: 0.49
Heparin Unfractionated: 0.7
Heparin Unfractionated: <0.1 — ABNORMAL LOW

## 2010-12-29 LAB — PROTIME-INR
INR: 0.9
INR: 1.1
INR: 1.1
INR: 1.5
INR: 1.5
INR: 1.6 — ABNORMAL HIGH
Prothrombin Time: 14.2
Prothrombin Time: 15
Prothrombin Time: 19.1 — ABNORMAL HIGH
Prothrombin Time: 59.2 — ABNORMAL HIGH

## 2010-12-29 LAB — CBC
HCT: 33 — ABNORMAL LOW
HCT: 33.4 — ABNORMAL LOW
HCT: 33.8 — ABNORMAL LOW
HCT: 37.7
HCT: 38.3
HCT: 39.5
HCT: 41.5
HCT: 41.9
Hemoglobin: 10.8 — ABNORMAL LOW
Hemoglobin: 10.8 — ABNORMAL LOW
Hemoglobin: 11 — ABNORMAL LOW
Hemoglobin: 11 — ABNORMAL LOW
Hemoglobin: 13.8
MCHC: 32.9
MCHC: 33
MCV: 83.9
MCV: 84.4
MCV: 85
MCV: 85.1
MCV: 85.8
Platelets: 229
Platelets: 307
Platelets: 366
Platelets: 383
Platelets: 426 — ABNORMAL HIGH
Platelets: 553 — ABNORMAL HIGH
RBC: 3.81 — ABNORMAL LOW
RBC: 3.81 — ABNORMAL LOW
RBC: 3.92
RBC: 4.16
RBC: 4.89
RDW: 16.5 — ABNORMAL HIGH
RDW: 16.8 — ABNORMAL HIGH
RDW: 16.8 — ABNORMAL HIGH
RDW: 16.8 — ABNORMAL HIGH
RDW: 17.2 — ABNORMAL HIGH
WBC: 10.1
WBC: 10.6 — ABNORMAL HIGH
WBC: 11.1 — ABNORMAL HIGH
WBC: 11.3 — ABNORMAL HIGH
WBC: 11.3 — ABNORMAL HIGH
WBC: 9

## 2010-12-29 LAB — DIFFERENTIAL
Basophils Absolute: 0.1
Basophils Relative: 1
Basophils Relative: 1
Eosinophils Absolute: 0.4
Eosinophils Absolute: 0.4
Eosinophils Relative: 2
Eosinophils Relative: 3
Monocytes Absolute: 0.6
Monocytes Relative: 5
Neutrophils Relative %: 78 — ABNORMAL HIGH

## 2010-12-29 LAB — BASIC METABOLIC PANEL
BUN: 10
CO2: 25
Calcium: 8.9
Calcium: 9.9
Chloride: 107
Creatinine, Ser: 0.95
GFR calc Af Amer: >60
GFR calc Af Amer: >60
GFR calc non Af Amer: >60
GFR calc non Af Amer: >60
Glucose, Bld: 101 — ABNORMAL HIGH
Potassium: 3.9
Sodium: 133 — ABNORMAL LOW
Sodium: 140
Sodium: 140

## 2010-12-29 LAB — APTT
aPTT: 24
aPTT: 80 — ABNORMAL HIGH

## 2010-12-29 LAB — POCT I-STAT 7, (LYTES, BLD GAS, ICA,H+H)
Calcium, Ion: 1.16
HCT: 31 — ABNORMAL LOW
Hemoglobin: 10.5 — ABNORMAL LOW
pCO2 arterial: 46 — ABNORMAL HIGH
pO2, Arterial: 471 — ABNORMAL HIGH

## 2010-12-29 LAB — POCT I-STAT, CHEM 8
HCT: 44
Hemoglobin: 15
Sodium: 135
TCO2: 30

## 2011-01-01 LAB — CBC
Hemoglobin: 11.9 g/dL — ABNORMAL LOW (ref 12.0–15.0)
MCHC: 32.1 g/dL (ref 30.0–36.0)
MCV: 82 fL (ref 78.0–100.0)
RBC: 4.51 MIL/uL (ref 3.87–5.11)

## 2011-01-01 LAB — BASIC METABOLIC PANEL
CO2: 30 mEq/L (ref 19–32)
Chloride: 100 mEq/L (ref 96–112)
Creatinine, Ser: 1.16 mg/dL (ref 0.4–1.2)
GFR calc Af Amer: 59 mL/min — ABNORMAL LOW (ref >60)
Glucose, Bld: 110 mg/dL — ABNORMAL HIGH (ref 70–99)

## 2011-01-01 LAB — PROTIME-INR
INR: 1.8 — ABNORMAL HIGH (ref 0.00–1.49)
INR: 1.9 — ABNORMAL HIGH (ref 0.00–1.49)
Prothrombin Time: 14.7 seconds (ref 11.6–15.2)
Prothrombin Time: 22.4 seconds — ABNORMAL HIGH (ref 11.6–15.2)
Prothrombin Time: 28.1 seconds — ABNORMAL HIGH (ref 11.6–15.2)

## 2011-09-11 ENCOUNTER — Encounter: Payer: Self-pay | Admitting: *Deleted

## 2012-04-04 ENCOUNTER — Encounter: Payer: Self-pay | Admitting: Vascular Surgery

## 2013-11-12 ENCOUNTER — Emergency Department (HOSPITAL_COMMUNITY)
Admission: EM | Admit: 2013-11-12 | Discharge: 2013-11-13 | Disposition: A | Discharge status: Home / Self Care | Payer: Medicare Other | Attending: Emergency Medicine | Admitting: Emergency Medicine

## 2013-11-12 ENCOUNTER — Emergency Department (HOSPITAL_COMMUNITY): Payer: Medicare Other

## 2013-11-12 ENCOUNTER — Encounter (HOSPITAL_COMMUNITY): Payer: Self-pay | Admitting: Emergency Medicine

## 2013-11-12 DIAGNOSIS — E119 Type 2 diabetes mellitus without complications: Secondary | ICD-10-CM | POA: Insufficient documentation

## 2013-11-12 DIAGNOSIS — Z7901 Long term (current) use of anticoagulants: Secondary | ICD-10-CM | POA: Diagnosis not present

## 2013-11-12 DIAGNOSIS — Z79899 Other long term (current) drug therapy: Secondary | ICD-10-CM | POA: Diagnosis not present

## 2013-11-12 DIAGNOSIS — Z7982 Long term (current) use of aspirin: Secondary | ICD-10-CM | POA: Diagnosis not present

## 2013-11-12 DIAGNOSIS — R197 Diarrhea, unspecified: Secondary | ICD-10-CM | POA: Insufficient documentation

## 2013-11-12 DIAGNOSIS — Z794 Long term (current) use of insulin: Secondary | ICD-10-CM | POA: Insufficient documentation

## 2013-11-12 DIAGNOSIS — Z862 Personal history of diseases of the blood and blood-forming organs and certain disorders involving the immune mechanism: Secondary | ICD-10-CM | POA: Insufficient documentation

## 2013-11-12 DIAGNOSIS — R1031 Right lower quadrant pain: Secondary | ICD-10-CM | POA: Diagnosis not present

## 2013-11-12 DIAGNOSIS — Z87891 Personal history of nicotine dependence: Secondary | ICD-10-CM | POA: Diagnosis not present

## 2013-11-12 DIAGNOSIS — E785 Hyperlipidemia, unspecified: Secondary | ICD-10-CM | POA: Diagnosis not present

## 2013-11-12 DIAGNOSIS — I10 Essential (primary) hypertension: Secondary | ICD-10-CM | POA: Diagnosis not present

## 2013-11-12 DIAGNOSIS — R112 Nausea with vomiting, unspecified: Secondary | ICD-10-CM

## 2013-11-12 DIAGNOSIS — Z880 Allergy status to penicillin: Secondary | ICD-10-CM | POA: Diagnosis not present

## 2013-11-12 LAB — CBC WITH DIFFERENTIAL/PLATELET
BASOS ABS: 0.1 10*3/uL (ref 0.0–0.1)
Basophils Relative: 1 % (ref 0–1)
EOS PCT: 2 % (ref 0–5)
Eosinophils Absolute: 0.3 10*3/uL (ref 0.0–0.7)
HCT: 43.9 % (ref 36.0–46.0)
Hemoglobin: 14.6 g/dL (ref 12.0–15.0)
Lymphocytes Relative: 41 % (ref 12–46)
Lymphs Abs: 4.6 10*3/uL — ABNORMAL HIGH (ref 0.7–4.0)
MCH: 29 pg (ref 26.0–34.0)
MCHC: 33.3 g/dL (ref 30.0–36.0)
MCV: 87.3 fL (ref 78.0–100.0)
Monocytes Absolute: 0.8 10*3/uL (ref 0.1–1.0)
Monocytes Relative: 7 % (ref 3–12)
NEUTROS PCT: 49 % (ref 43–77)
Neutro Abs: 5.5 10*3/uL (ref 1.7–7.7)
Platelets: 235 10*3/uL (ref 150–400)
RBC: 5.03 MIL/uL (ref 3.87–5.11)
RDW: 14.7 % (ref 11.5–15.5)
WBC: 11.2 10*3/uL — ABNORMAL HIGH (ref 4.0–10.5)

## 2013-11-12 LAB — COMPREHENSIVE METABOLIC PANEL
ALT: 47 U/L — AB (ref 0–35)
AST: 29 U/L (ref 0–37)
Albumin: 4.2 g/dL (ref 3.5–5.2)
Alkaline Phosphatase: 80 U/L (ref 39–117)
Anion gap: 16 — ABNORMAL HIGH (ref 5–15)
BUN: 17 mg/dL (ref 6–23)
CALCIUM: 10.1 mg/dL (ref 8.4–10.5)
CO2: 23 meq/L (ref 19–32)
Chloride: 97 mEq/L (ref 96–112)
Creatinine, Ser: 1.12 mg/dL — ABNORMAL HIGH (ref 0.50–1.10)
GFR calc Af Amer: 62 mL/min — ABNORMAL LOW (ref >90)
GFR, EST NON AFRICAN AMERICAN: 53 mL/min — AB (ref >90)
Glucose, Bld: 125 mg/dL — ABNORMAL HIGH (ref 70–99)
POTASSIUM: 3.7 meq/L (ref 3.7–5.3)
SODIUM: 136 meq/L — AB (ref 137–147)
Total Bilirubin: 0.6 mg/dL (ref 0.3–1.2)
Total Protein: 7.9 g/dL (ref 6.0–8.3)

## 2013-11-12 LAB — PROTIME-INR
INR: 1.42 (ref 0.00–1.49)
Prothrombin Time: 17.4 seconds — ABNORMAL HIGH (ref 11.6–15.2)

## 2013-11-12 LAB — LIPASE, BLOOD: Lipase: 14 U/L (ref 11–59)

## 2013-11-12 LAB — LACTIC ACID, PLASMA: Lactic Acid, Venous: 2 mmol/L (ref 0.5–2.2)

## 2013-11-12 MED ORDER — MORPHINE SULFATE 2 MG/ML IJ SOLN
2.0000 mg | INTRAMUSCULAR | Status: DC | PRN
  Filled 2013-11-12: qty 1

## 2013-11-12 MED ORDER — IOHEXOL 300 MG/ML  SOLN
100.0000 mL | Freq: Once | INTRAMUSCULAR | Status: AC | PRN
  Administered 2013-11-12: 100 mL via INTRAVENOUS

## 2013-11-12 MED ORDER — ONDANSETRON HCL 4 MG/2ML IJ SOLN
4.0000 mg | INTRAMUSCULAR | Status: DC | PRN
  Administered 2013-11-12: 4 mg via INTRAVENOUS
  Filled 2013-11-12: qty 2

## 2013-11-12 MED ORDER — FENTANYL CITRATE 0.05 MG/ML IJ SOLN
25.0000 ug | INTRAMUSCULAR | Status: DC | PRN
  Administered 2013-11-12: 25 ug via INTRAVENOUS
  Filled 2013-11-12: qty 2

## 2013-11-12 MED ORDER — IOHEXOL 300 MG/ML  SOLN
50.0000 mL | Freq: Once | INTRAMUSCULAR | Status: AC | PRN
  Administered 2013-11-12: 50 mL via ORAL

## 2013-11-12 MED ORDER — SODIUM CHLORIDE 0.9 % IV SOLN
INTRAVENOUS | Status: DC
  Administered 2013-11-12: 21:00:00 via INTRAVENOUS

## 2013-11-12 NOTE — ED Notes (Signed)
Family at bedside.Patietn was able to perform the lying and sitting of orthostatic vitals. When patient went to stand she became very dizzy and unsteady. Assisted patient back to bed. RN Bronson IngYvette made aware.

## 2013-11-12 NOTE — ED Provider Notes (Signed)
CSN: [id number]     Arrival date & time 11/12/13  1959 History   First MD Initiated Contact with Patient 11/12/13 2015     Chief Complaint  Patient presents with  . Nausea  . Emesis     HPI Pt was seen at 2025. Per pt, c/o gradual onset and persistence of multiple intermittent episodes of N/V/D for the past 3 days.   Describes the stools as "green." Has been associated with RLQ abd "pain." Pt states she was evaluated by her PMD 2 days ago for her symptoms, dx "virus" but was not prescribed any medications for her symptoms. States she has not taken her usual home meds in several days. Denies any others in household with same symptoms. Denies CP/SOB, no back pain, no fevers, no black or blood in stools or emesis.     Past Medical History  Diagnosis Date  . Clotting disorder     Hx of Chronic Venous problems in her legs  . Hypertension   . Hyperlipidemia   . Diabetes mellitus    Past Surgical History  Procedure Laterality Date  . 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 Vascular surgery     History reviewed. No pertinent family history. History  Substance Use Topics  . Smoking status: Former Games developermoker  . Smokeless tobacco: Not on file     Comment: Quit in 2009  . Alcohol Use: No    Review of Systems ROS: Statement: All systems negative except as marked or noted in the HPI; Constitutional: Negative for fever and chills. ; ; Eyes: Negative for eye pain, redness and discharge. ; ; ENMT: Negative for ear pain, hoarseness, nasal congestion, sinus pressure and sore throat. ; ; Cardiovascular: Negative for chest pain, palpitations, diaphoresis, dyspnea and peripheral edema. ; ; Respiratory: Negative for cough, wheezing and stridor. ; ; Gastrointestinal: +N/V/D, abd pain. Negative for blood in stool, hematemesis, jaundice and rectal bleeding. . ; ; Genitourinary: Negative for dysuria, flank pain and hematuria. ; ; Musculoskeletal: Negative for back pain and neck pain. Negative for swelling  and trauma.; ; Skin: Negative for pruritus, rash, abrasions, blisters, bruising and skin lesion.; ; Neuro: Negative for headache, lightheadedness and neck stiffness. Negative for weakness, altered level of consciousness , altered mental status, extremity weakness, paresthesias, involuntary movement, seizure and syncope.      Allergies  Codeine; Penicillins; and Sulfur  Home Medications   Prior to Admission medications   Medication Sig Start Date End Date Taking? Authorizing Provider  aspirin 325 MG tablet Take 325 mg by mouth daily.    Historical Provider, MD  Budesonide-Formoterol Fumarate (SYMBICORT IN) Inhale into the lungs 2 (two) times daily.    Historical Provider, MD  fenofibrate 160 MG tablet Take 160 mg by mouth daily.    Historical Provider, MD  furosemide (LASIX) 40 MG tablet Take 40 mg by mouth daily.    Historical Provider, MD  glipiZIDE (GLUCOTROL) 10 MG tablet Take 10 mg by mouth daily.    Historical Provider, MD  insulin glargine (LANTUS) 100 UNIT/ML injection Inject 15 Units into the skin at bedtime.    Historical Provider, MD  pravastatin (PRAVACHOL) 80 MG tablet Take 80 mg by mouth daily.    Historical Provider, MD  warfarin (COUMADIN) 2.5 MG tablet Take 2.5 mg by mouth. Take as Directed    Historical Provider, MD   BP 108/89  Pulse 97  Temp(Src) 98.3 F (36.8 C) (Oral)  Resp 18  Ht 4\' 11"  (1.499 m)  Wt 186 lb (84.369 kg)  BMI 37.55 kg/m2  SpO2 98%   22:43 Orthostatic Vital Signs  Orthostatic Lying - BP- Lying: 119/67 mmHg ; Pulse- Lying: 101  Orthostatic Sitting - BP- Sitting: 156/89 mmHg ; Pulse- Sitting: 108    Physical Exam 2030: Physical examination:  Nursing notes reviewed; Vital signs and O2 SAT reviewed;  Constitutional: Well developed, Well nourished, In no acute distress; Head:  Normocephalic, atraumatic; Eyes: EOMI, PERRL, No scleral icterus; ENMT: Mouth and pharynx normal, Mucous membranes dry; Neck: Supple, Full range of motion, No  lymphadenopathy; Cardiovascular: Regular rate and rhythm, No murmur, rub, or gallop; Respiratory: Breath sounds clear & equal bilaterally, No rales, rhonchi, wheezes.  Speaking full sentences with ease, Normal respiratory effort/excursion; Chest: Nontender, Movement normal; Abdomen: Soft, +RLQ tenderness to palp. No rebound or guarding. Nondistended, Normal bowel sounds; Genitourinary: No CVA tenderness; Extremities: Pulses normal, No tenderness, No edema, No calf edema or asymmetry.; Neuro: AA&Ox3, Major CN grossly intact.  Speech clear. No gross focal motor or sensory deficits in extremities.; Skin: Color normal, Warm, Dry.   ED Course  Procedures     EKG Interpretation None      MDM  MDM Reviewed: previous chart, nursing note and vitals Reviewed previous: labs Interpretation: labs, x-ray and CT scan    Results for orders placed during the hospital encounter of 11/12/13  CBC WITH DIFFERENTIAL      Result Value Ref Range   WBC 11.2 (*) 4.0 - 10.5 K/uL   RBC 5.03  3.87 - 5.11 MIL/uL   Hemoglobin 14.6  12.0 - 15.0 g/dL   HCT 16.1  09.6 - 04.5 %   MCV 87.3  78.0 - 100.0 fL   MCH 29.0  26.0 - 34.0 pg   MCHC 33.3  30.0 - 36.0 g/dL   RDW 40.9  81.1 - 91.4 %   Platelets 235  150 - 400 K/uL   Neutrophils Relative % 49  43 - 77 %   Neutro Abs 5.5  1.7 - 7.7 K/uL   Lymphocytes Relative 41  12 - 46 %   Lymphs Abs 4.6 (*) 0.7 - 4.0 K/uL   Monocytes Relative 7  3 - 12 %   Monocytes Absolute 0.8  0.1 - 1.0 K/uL   Eosinophils Relative 2  0 - 5 %   Eosinophils Absolute 0.3  0.0 - 0.7 K/uL   Basophils Relative 1  0 - 1 %   Basophils Absolute 0.1  0.0 - 0.1 K/uL  COMPREHENSIVE METABOLIC PANEL      Result Value Ref Range   Sodium 136 (*) 137 - 147 mEq/L   Potassium 3.7  3.7 - 5.3 mEq/L   Chloride 97  96 - 112 mEq/L   CO2 23  19 - 32 mEq/L   Glucose, Bld 125 (*) 70 - 99 mg/dL   BUN 17  6 - 23 mg/dL   Creatinine, Ser 7.82 (*) 0.50 - 1.10 mg/dL   Calcium 95.6  8.4 - 21.3 mg/dL   Total  Protein 7.9  6.0 - 8.3 g/dL   Albumin 4.2  3.5 - 5.2 g/dL   AST 29  0 - 37 U/L   ALT 47 (*) 0 - 35 U/L   Alkaline Phosphatase 80  39 - 117 U/L   Total Bilirubin 0.6  0.3 - 1.2 mg/dL   GFR calc non Af Amer 53 (*) >90 mL/min   GFR calc Af Amer 62 (*) >90  mL/min   Anion gap 16 (*) 5 - 15  LIPASE, BLOOD      Result Value Ref Range   Lipase 14  11 - 59 U/L  LACTIC ACID, PLASMA      Result Value Ref Range   Lactic Acid, Venous 2.0  0.5 - 2.2 mmol/L  PROTIME-INR      Result Value Ref Range   Prothrombin Time 17.4 (*) 11.6 - 15.2 seconds   INR 1.42  0.00 - 1.49   Dg Chest 1 View 11/12/2013   CLINICAL DATA:  Abdominal pain  EXAM: CHEST - 1 VIEW  COMPARISON:  04/25/2010  FINDINGS: Normal heart size and mediastinal contours. No acute infiltrate or edema. No effusion or pneumothorax. No acute osseous findings.  IMPRESSION: No active disease.   Electronically Signed   By: Tiburcio Pea M.D.   On: 11/12/2013 23:26   Ct Abdomen Pelvis W Contrast 11/12/2013   CLINICAL DATA:  Abdominal pain and nausea.  EXAM: CT ABDOMEN AND PELVIS WITH CONTRAST  TECHNIQUE: Multidetector CT imaging of the abdomen and pelvis was performed using the standard protocol following bolus administration of intravenous contrast.  CONTRAST:  50mL OMNIPAQUE IOHEXOL 300 MG/ML SOLN, OMNIPAQUE IOHEXOL 300 MG/ML SOLN  COMPARISON:  None.  FINDINGS: Lung bases:  A small nodule is noted in the right middle lobe. No infiltrates or effusions.  CT abdomen: There is severe and diffuse fatty infiltration of the liver with some focal fatty sparing near the gallbladder. No worrisome hepatic lesions or intrahepatic ductal dilatation. The gallbladder is normal. No common bile duct dilatation. The pancreas is normal. The spleen is normal in size. No focal lesions. The adrenal glands kidneys are normal. There is a simple appearing right renal cyst.  The stomach, duodenum, small bowel and colon are unremarkable. No inflammatory changes, mass lesions  or obstructive findings. The appendix is normal. No mesenteric or retroperitoneal mass or adenopathy. The aorta is normal in caliber. There are advanced atherosclerotic calcifications. The branch vessels are patent. The major venous structures are patent.  CT pelvis:  The uterus and ovaries are normal. The bladder is normal. No pelvic mass, adenopathy or free pelvic fluid collections. No inguinal mass or adenopathy.  The bony structures are unremarkable.  IMPRESSION: No acute abdominal/ pelvic findings, mass lesions or adenopathy.  Advanced atherosclerotic changes involving the aorta but no focal aneurysm or dissection.  Severe and diffuse fatty infiltration of the liver.   Electronically Signed   By: Loralie Champagne M.D.   On: 11/12/2013 23:31    0005:  Workup reassuring. Pt states she has not taken her coumadin in several days; will give her usual dose here. Pt is not orthostatic. Pt has tol PO well while in the ED without N/V. Pt has not stooled while in the ED. Abd remains soft/NT, VSS.  Pt wants to go home. UA pending. Sign out to Dr. Estell Harpin.   Samuel Jester, DO 11/13/13 0007

## 2013-11-12 NOTE — ED Notes (Signed)
Pt has been having abdominal pain, N &V, diarrhea, x 3 days, diabetic unable to take med, EMS B/S120 mg/dl.

## 2013-11-13 LAB — URINALYSIS, ROUTINE W REFLEX MICROSCOPIC
Bilirubin Urine: NEGATIVE
Glucose, UA: NEGATIVE mg/dL
Hgb urine dipstick: NEGATIVE
Ketones, ur: NEGATIVE mg/dL
LEUKOCYTES UA: NEGATIVE
Nitrite: NEGATIVE
PROTEIN: NEGATIVE mg/dL
Urobilinogen, UA: 0.2 mg/dL (ref 0.0–1.0)
pH: 5.5 (ref 5.0–8.0)

## 2013-11-13 MED ORDER — ONDANSETRON HCL 4 MG PO TABS: 4.0000 mg | ORAL_TABLET | Freq: Three times a day (TID) | ORAL | Status: AC | PRN

## 2013-11-13 MED ORDER — WARFARIN SODIUM 5 MG PO TABS
5.0000 mg | ORAL_TABLET | Freq: Once | ORAL | Status: AC
  Administered 2013-11-13: 5 mg via ORAL
  Filled 2013-11-13: qty 1

## 2013-11-13 NOTE — Discharge Instructions (Signed)
°Emergency Department Resource Guide °1) Find a Doctor and Pay Out of Pocket °Although you won't have to find out who is covered by your insurance plan, it is a good idea to ask around and get recommendations. You will then need to call the office and see if the doctor you have chosen will accept you as a new patient and what types of options they offer for patients who are self-pay. Some doctors offer discounts or will set up payment plans for their patients who do not have insurance, but you will need to ask so you aren't surprised when you get to your appointment. ° °2) Contact Your Local Health Department °Not all health departments have doctors that can see patients for sick visits, but many do, so it is worth a call to see if yours does. If you don't know where your local health department is, you can check in your phone book. The CDC also has a tool to help you locate your state's health department, and many state websites also have listings of all of their local health departments. ° °3) Find a Walk-in Clinic °If your illness is not likely to be very severe or complicated, you may want to try a walk in clinic. These are popping up all over the country in pharmacies, drugstores, and shopping centers. They're usually staffed by nurse practitioners or physician assistants that have been trained to treat common illnesses and complaints. They're usually fairly quick and inexpensive. However, if you have serious medical issues or chronic medical problems, these are probably not your best option. ° °No Primary Care Doctor: °- Call [facility] Connect at  832-8000 - they can help you locate a primary care doctor that  accepts your insurance, provides certain services, etc. °- Physician Referral Service- 1-800-533-3463 ° °Chronic Pain Problems: °Organization         Address  Phone   Notes  °[facility]  ([phone]) [phone] Patients need to be referred by their primary care doctor.  ° °Medication  Assistance: °Organization         Address  Phone   Notes  °[place] [facility] [address] °[place], [place] [address] (336) 641-8030 --Must be a resident of [place] °-- Must have NO insurance coverage whatsoever (no Medicaid/ Medicare, etc.) °-- The pt. MUST have a primary care doctor that directs their care regularly and follows them in the community °  °[facility]  (866) 331-1348   °[employer]  (888) 892-1162   ° °Agencies that provide inexpensive medical care: °Organization         Address  Phone   Notes  °[facility]  (336) 832-8035   °[facility]    (336) 832-7272   °[facility] [address] °[place], [place] [address] (336) 832-4777   °[facility] [address], °[place] (336) 271-4999   °[facility]    (336) 373-0678   °[facility]    (336) 272-1050   °[facility] ° [address], [place] Phone:  (336) 832-4444, Fax:  (336) 832-4440 Hours of Operation:  9 am - 6 pm, M-F.  Also accepts Medicaid/Medicare and self-pay.  °[facility] ° [address], [place] Phone: (336) [phone], Fax: (336) [phone]. Hours of Operation:  8:30 am - 5:30 pm, M-F.  Also accepts Medicaid and self-pay.  °[facility] [place] [address], [place] Phone: (336) 878-6027   °[facility] [address], [place], [place] (336)723-1848, Ext. 123 Mondays & Thursdays: 7-9 AM.  First 15 patients are seen on a first come, first serve basis. °  ° °Medicaid-accepting [place] Providers: ° °Organization         Address  Phone   Notes  °[facility] [address], [place] (336) [phone] Also accepts self-pay patients.  °[facility] [address], [place] ° (336) 856-9996   °[facility] [address], [place]  (336) 288-8857   °[facility] [address] [place] [address], [place] (336) 299-7000   °Veita Bland [address], [place]  ° (336) 373-1557 Only accepts [place] Access Medicaid patients after they have their name applied to their card.  ° °Self-Pay (no insurance) in [place]: ° °Organization         Address  Phone   Notes  °Sickle Cell Patients, [facility] [address], [place] (336) 832-1970   °[facility] [address], [place] (336) 832-4400   °[facility] ° [address], [place] (336) 992-4800   °[facility]/Dr. Osei-Bonsu ° [address] [place] [address], [place] or [address], [place] (336) [phone] Phone number for both [place] and [place] locations is the same.  °[facility] [address], [place] (336) 299-0000   °[facility] [address] [place] [address], [place] or [address] (336) 852-7530 °(336) 878-2260   °[facility] [address], [place] (336) 350-1642, phone; (336) 294-5005, fax Sees patients 1st and 3rd Saturday of every month.  Must not qualify for public or private insurance (i.e. Medicaid, Medicare, [place] Health Choice, Veterans' Benefits) • Household income should be no more than 200% of the poverty level •The clinic cannot treat you if you are pregnant or think you are pregnant • Sexually transmitted diseases are not treated at the clinic.  ° ° °Dental Care: °Organization         Address  Phone  Notes  °[place] [facility] [address], [place] (336) 641-6152 Accepts children up to age 21 who are enrolled in Medicaid or [place] Health Choice; pregnant women with a Medicaid card; and children who have applied for Medicaid or [place] Health Choice, but were declined, whose parents can pay a reduced fee at time of service.  °[place]  [facility] [place]  [address], [place] (336) 641-7733 Accepts children up to age 21 who are enrolled in Medicaid or [place] Health Choice; pregnant women with a Medicaid card; and children who have applied for Medicaid or [place] Health Choice, but were declined, whose parents can pay a reduced fee at time of service.  °[facility] ° [address], [place] (336) 641-4533 Patients are seen by appointment only. Walk-ins are not accepted. [facility] will see patients 18 years of age and older. °Monday - Tuesday (8am-5pm) °Most Wednesdays (8:30-5pm) °$30 per visit, cash only  °[facility] ° [address], [place] (336) 641-4533 Patients are seen by appointment only. Walk-ins are not accepted. [facility] will see patients 18 years of age and older. °One   Wednesday Evening (Monthly: Volunteer Based).  $30 per visit, cash only  °[facility]  (919) [phone] for adults; Children under age 4, call Graduate Pediatric Dentistry at ([phone]) [phone]. Children aged 4-14, please call ([phone]) [phone] to request a pediatric application. ° Dental services are provided in all areas of dental care including fillings, crowns and bridges, complete and partial dentures, implants, gum treatment, root canals, and extractions. Preventive care is also provided. Treatment is provided to both adults and children. °Patients are selected via a lottery and there is often a waiting list. °  °[facility] [address], °[place] ° (336) 763-8833 [email or web address] °  °[facility] [address], [place], [place] (336)723-1848, Ext. 123 Second and Fourth Thursday of each month, opens at 6:30 AM; Clinic ends at 9 AM.  Patients are seen on a first-come first-served basis, and a limited number are seen during each clinic.  ° °[facility] ° [address], [place], [place] (336) 723-7904    Eligibility Requirements °You must have lived in [place], [place], or [place] counties for at least the last three months. °  You cannot be eligible for state or federal sponsored healthcare insurance, including Veterans Administration, Medicaid, or Medicare. °  You generally cannot be eligible for healthcare insurance through your employer.  °  How to apply: °Eligibility screenings are held every Tuesday and Wednesday afternoon from 1:00 pm until 4:00 pm. You do not need an appointment for the interview!  °[facility] [address], [place], [place] 336-631-2330   °[facility]  336-342-8273   °[facility]  336-703-3100   °[facility]  336-570-6415   ° °Behavioral Health Resources in the Community: °Intensive Outpatient Programs °Organization         Address  Phone  Notes  °[facility] [address], [place], [place] 336-878-6098   °[facility] [address], [place], [place] 336-832-9800   °[facility] [address], [place], [place] ° 336-882-2125   °[facility] [address],  °[place], [place] 1-800-853-5163 or 336-641-4981   °Substance Abuse Resources °Organization         Address  Phone  Notes  °[facility]  336-882-2125   °[facility]  336-784-9470   °[facility]  336-285-9073   °[facility]  336-845-3988   °Residential & Outpatient Substance Abuse Program  1-800-659-3381   °Psychological Services °Organization         Address  Phone  Notes  °[facility]  336- 832-9600   °[facility]  336- 378-7881   °[facility] [address], [place] 1-800-853-5163 or 336-641-4981   ° °Mobile Crisis Teams °Organization         Address  Phone  Notes  °[facility], Mobile Crisis Care Unit  1-877-626-1772   °Assertive °[facility] ° [address]  [place], [place] 336-834-9664   °Sharon DeEsch [address] °[place] 336-554-5454   ° °Self-Help/Support Groups °Organization         Address  Phone             Notes  °[facility] of  - variety of support groups  336- 373-1402 Call for more information  °Narcotics Anonymous (NA), [facility] [address], °[place]  2 meetings at this location  ° °  Residential Treatment Programs Organization         Address  Phone  Notes  [facility] [address],    [place]  [phone]) [phone]   [facility]  [address], [place] [phone], [place], [place] [phone]   [facility] [address], [place] [phone] Admissions: 8am-3pm M-F  [facility] [address]    [place], [place] [phone]   [facility] [address], [place], [place] [phone]   [facility] [address]  [place], [place] [phone]   [facility] Intensive Outpatient [address], [place], [place] [phone]   [facility] [address]  [place], [place] [phone] or [phone]   [facility] ([facility]) [address] [place], [place] [phone] Accepts Medicaid  [facility] [address]  [place] [phone] Substance Abuse/Addiction Treatment   [facility] Resources Organization         Address  Phone  Notes  [facility]  (253)042-2207(888) 820-625-1200   Angie FavaJulie Brannon, PhD [address], [address] [place], [place]   [phone](336) [phone] or [phone](336) [phone]   [facility]   [address] [place], [place] [phone](336) [phone]   [facility] [address], [place], [place] ([phone](336) [phone] Insurance/Medicaid/sponsorship through [facility] [address]                                    [place], [place] [phone]) [phone] Therapy/tele-psych/case    [facility] [address]   [place], [place] [phone](336) [phone]    Dr. Lolly MustacheArfeen  [phone](336) [phone]   [facility] [place] [facility]  [employer] [facility] 1) [address], [place] 2) [address], [place] 3)  [address], [place] [phone](336) [phone](336) [phone]  [phone](336) [phone]   [facility] Child Abuse Hotline [phone](336) (512)259-2765 or 9168724603(336) [phone] (After Hours)       Take the prescription as directed.  Increase your fluid intake (ie:  Gatoraide) for the next few days, as discussed.  Eat a bland diet and advance to your regular diet slowly as you can tolerate it.   Avoid full strength juices, as well as milk and milk products until your diarrhea has resolved.   Call your regular medical doctor this morning to schedule a follow up appointment in the next 2 days.  Return to the Emergency Department immediately if not improving (or even worsening) despite taking the medicines as prescribed, any black or bloody stool or vomit, if you develop a fever over "101," or for any other concerns.

## 2013-11-13 NOTE — ED Notes (Signed)
Pt tolerated po fluid challenge w/o any difficulties. 

## 2013-11-14 LAB — URINE CULTURE: Colony Count: 80000

## 2022-02-21 ENCOUNTER — Other Ambulatory Visit: Payer: Self-pay

## 2022-02-21 ENCOUNTER — Encounter (HOSPITAL_BASED_OUTPATIENT_CLINIC_OR_DEPARTMENT_OTHER): Payer: Self-pay | Admitting: Emergency Medicine

## 2022-02-21 ENCOUNTER — Emergency Department (HOSPITAL_BASED_OUTPATIENT_CLINIC_OR_DEPARTMENT_OTHER)
Admission: EM | Admit: 2022-02-21 | Discharge: 2022-02-21 | Disposition: A | Discharge status: Home / Self Care | Payer: Medicare Other | Attending: Emergency Medicine | Admitting: Emergency Medicine

## 2022-02-21 DIAGNOSIS — Z7982 Long term (current) use of aspirin: Secondary | ICD-10-CM | POA: Insufficient documentation

## 2022-02-21 DIAGNOSIS — E1165 Type 2 diabetes mellitus with hyperglycemia: Secondary | ICD-10-CM | POA: Diagnosis not present

## 2022-02-21 DIAGNOSIS — Z794 Long term (current) use of insulin: Secondary | ICD-10-CM | POA: Diagnosis not present

## 2022-02-21 DIAGNOSIS — R21 Rash and other nonspecific skin eruption: Secondary | ICD-10-CM | POA: Diagnosis not present

## 2022-02-21 DIAGNOSIS — Z79899 Other long term (current) drug therapy: Secondary | ICD-10-CM | POA: Insufficient documentation

## 2022-02-21 LAB — CBG MONITORING, ED: Glucose-Capillary: 122 mg/dL — ABNORMAL HIGH (ref 70–99)

## 2022-02-21 MED ORDER — CLOTRIMAZOLE 1 % EX CREA: TOPICAL_CREAM | CUTANEOUS | 0 refills | Status: AC

## 2022-02-21 MED ORDER — PREDNISONE 20 MG PO TABS: 40.0000 mg | ORAL_TABLET | Freq: Every day | ORAL | 0 refills | Status: AC

## 2022-02-21 NOTE — ED Triage Notes (Signed)
Persistent rash , under breast , scalp , bilateral hands , was seen 2 weeks ago for this , antibiotic prescribed , rash got worse she said

## 2022-02-21 NOTE — ED Provider Notes (Signed)
[facility] EMERGENCY DEPARTMENT Provider Note   CSN: [id number] Arrival date & time: 02/21/22  0759     History  Chief Complaint  Patient presents with   Rash    [name] is a 66 y.o. female.  Patient with h/o diabetes presents for evaluation of rash.  She states that she was given an antibiotic for UTI (Keflex) and after this she developed a rash in the folds of her breast, groin, slightly on her hands and in her scalp.  She notes flaking and scaling of the scalp.  She states that her sugars have been "okay ".  Rash is itchy.  No fevers, nausea or vomiting.  She discontinued the antibiotic.       Home Medications Prior to Admission medications   Medication Sig Start Date End Date Taking? Authorizing Provider  allopurinol (ZYLOPRIM) 300 MG tablet Take 300 mg by mouth daily.    [provider]  aspirin 81 MG chewable tablet Chew 81 mg by mouth daily.    [provider]  atorvastatin (LIPITOR) 80 MG tablet Take 80 mg by mouth daily.    [provider]  colchicine 0.6 MG tablet Take 0.6 mg by mouth daily.    [provider]  cyclobenzaprine (FLEXERIL) 10 MG tablet Take 10 mg by mouth 3 (three) times daily as needed for muscle spasms.    [provider]  diazepam (VALIUM) 5 MG tablet Take 5 mg by mouth at bedtime as needed (sleep).    [provider]  DULoxetine (CYMBALTA) 30 MG capsule Take 30 mg by mouth daily.    [provider]  ezetimibe (ZETIA) 10 MG tablet Take 10 mg by mouth daily.    [provider]  fenofibrate 160 MG tablet Take 160 mg by mouth daily.    [provider]  folic acid (FOLVITE) 1 MG tablet Take 1 mg by mouth daily.    [provider]  furosemide (LASIX) 40 MG tablet Take 20 mg by mouth daily.     [provider]  gabapentin (NEURONTIN) 600 MG tablet Take 600 mg by mouth 3 (three) times daily.    [provider]  HYDROmorphone  (DILAUDID) 2 MG tablet Take 2 mg by mouth every 8 (eight) hours as needed for severe pain (pain).    [provider]  insulin glargine (LANTUS) 100 UNIT/ML injection Inject 50 Units into the skin 2 (two) times daily.    [provider]  insulin lispro (HUMALOG) 100 UNIT/ML injection Inject 40 Units into the skin 3 (three) times daily before meals.    [provider]  lisinopril (PRINIVIL,ZESTRIL) 10 MG tablet Take 10 mg by mouth daily.    [provider]  meclizine (ANTIVERT) 25 MG tablet Take 25 mg by mouth 3 (three) times daily as needed for dizziness or nausea (for neurontin side effects).    [provider]  metoprolol succinate (TOPROL-XL) 50 MG 24 hr tablet Take 50 mg by mouth daily. Take with or immediately following a meal.    [provider]  montelukast (SINGULAIR) 10 MG tablet Take 10 mg by mouth at bedtime.    [provider]  omeprazole (PRILOSEC) 20 MG capsule Take 20 mg by mouth daily.    [provider]  ondansetron (ZOFRAN) 4 MG tablet Take 4 mg by mouth every 8 (eight) hours as needed for nausea or vomiting.    [provider]  ondansetron (ZOFRAN) 4 MG tablet Take  1 tablet (4 mg total) by mouth every 8 (eight) hours as needed for nausea or vomiting. 11/13/13   Samuel Jester, DO  potassium chloride SA (K-DUR,KLOR-CON) 20 MEQ tablet Take 20 mEq by mouth daily.    [provider]  promethazine-dextromethorphan (PROMETHAZINE-DM) 6.25-15 MG/5ML syrup Take 5 mLs by mouth 4 (four) times daily as needed for cough.    [provider]  traMADol (ULTRAM) 50 MG tablet Take 50 mg by mouth every 8 (eight) hours as needed (pain).    [provider]  triamcinolone ointment (KENALOG) 0.1 % Apply 1 application topically 2 (two) times daily.    [provider]  warfarin (COUMADIN) 5 MG tablet Take 2.5-5 mg by mouth daily. Patient takes 5mg  on Mon.,& Fri.,and 2.5mg  all other days     [provider]      Allergies    Codeine, Elemental sulfur, Eliquis [apixaban], Lyrica [pregabalin], Morphine and related, Penicillins, and Shellfish allergy    Review of Systems   Review of Systems  Physical Exam Updated Vital Signs BP (!) 142/73 (BP Location: Right Arm)   Pulse 79   Temp 98.1 F (36.7 C) (Oral)   Resp (!) 22   Ht 4\' 11"  (1.499 m)   Wt 82.1 kg   SpO2 97%   BMI 36.56 kg/m   Physical Exam Vitals and nursing note reviewed.  Constitutional:      Appearance: She is well-developed.  HENT:     Head: Normocephalic and atraumatic.  Eyes:     Conjunctiva/sclera: Conjunctivae normal.  Pulmonary:     Effort: No respiratory distress.  Musculoskeletal:     Cervical back: Normal range of motion and neck supple.  Skin:    General: Skin is warm and dry.     Findings: Rash present.     Comments: Patient with hyperpigmented skin, couple circular lesions, in the folds beneath the breast in the groin, concerning for tinea.  Patient reports rash on her fingers and hands, however I only see excoriation, no significant rash.  Patient with significant flaking of the scalp.  Neurological:     Mental Status: She is alert.     ED Results / Procedures / Treatments   Labs (all labs ordered are listed, but only abnormal results are displayed) Labs Reviewed  CBG MONITORING, ED    EKG None  Radiology No results found.  Procedures Procedures    Medications Ordered in ED Medications - No data to display  ED Course/ Medical Decision Making/ A&P    Patient seen and examined. History obtained directly from patient.   Labs/EKG: Ordered CBG  Imaging: None ordered  Medications/Fluids: None ordered  Most recent vital signs reviewed and are as follows: BP (!) 142/73 (BP Location: Right Arm)   Pulse 79   Temp 98.1 F (36.7 C) (Oral)   Resp (!) 22   Ht 4\' 11"  (1.499 m)   Wt 82.1 kg   SpO2 97%   BMI 36.56 kg/m   Initial impression: Tinea  corporis  9:43 AM   Labs personally reviewed and interpreted including: CBG only minimally elevated  Plan: Discharge to home.   Prescriptions written for: Prednisone and topical clotrimazole  Other home care instructions discussed: Use of Selsun Blue shampoo  ED return instructions discussed: Worsening pain, fever or other concerns  Follow-up instructions discussed: Patient encouraged to follow-up with their PCP in 7 days.  Medical Decision Making  Patient with nonspecific rash.  Patient concerned because this started after taking antibiotic.  Clinically her symptoms are most concerning for tinea corporis which will be treated with topical azole and also encouraged selenium shampoo.  Will give 5-day course of prednisone as sugar is reasonably controlled for associated inflammation and also concern for allergic reaction.         Final Clinical Impression(s) / ED Diagnoses Final diagnoses:  Rash    Rx / DC Orders ED Discharge Orders          Ordered    predniSONE (DELTASONE) 20 MG tablet  Daily        02/21/22 0942    clotrimazole (LOTRIMIN) 1 % cream        02/21/22 0942              Renne Crigler, PA-C 02/21/22 0948    Rondel Baton, MD 02/28/22 [phone]

## 2022-02-21 NOTE — Discharge Instructions (Signed)
For your rash, take the oral prednisone once a day for 5 days.  Please monitor your blood sugars carefully and stop if your blood sugar is persistently above 300.  You may also apply the topical antifungal cream to the groin and beneath the breasts and to any itchy areas on your hands.  I would recommend using a shampoo with selenium, such as Selsun Blue, on the scalp as well.   Follow-up with your doctor in 1 week for recheck.
